# Patient Record
Sex: Female | Born: 1940 | Hispanic: No | Marital: Single | Smoking: Current every day smoker
Health system: Southern US, Community
[De-identification: ages and names within clinical notes are randomized; demographics above are authoritative.]

## PROBLEM LIST (undated history)

## (undated) DIAGNOSIS — E785 Hyperlipidemia, unspecified: Secondary | ICD-10-CM

## (undated) DIAGNOSIS — Z862 Personal history of diseases of the blood and blood-forming organs and certain disorders involving the immune mechanism: Secondary | ICD-10-CM

## (undated) DIAGNOSIS — D649 Anemia, unspecified: Secondary | ICD-10-CM

## (undated) DIAGNOSIS — M109 Gout, unspecified: Secondary | ICD-10-CM

## (undated) DIAGNOSIS — F0390 Unspecified dementia without behavioral disturbance: Secondary | ICD-10-CM

## (undated) DIAGNOSIS — I10 Essential (primary) hypertension: Secondary | ICD-10-CM

## (undated) DIAGNOSIS — S069XAA Unspecified intracranial injury with loss of consciousness status unknown, initial encounter: Secondary | ICD-10-CM

## (undated) DIAGNOSIS — S6990XA Unspecified injury of unspecified wrist, hand and finger(s), initial encounter: Secondary | ICD-10-CM

## (undated) DIAGNOSIS — N189 Chronic kidney disease, unspecified: Secondary | ICD-10-CM

## (undated) DIAGNOSIS — S069X9A Unspecified intracranial injury with loss of consciousness of unspecified duration, initial encounter: Secondary | ICD-10-CM

## (undated) DIAGNOSIS — E119 Type 2 diabetes mellitus without complications: Secondary | ICD-10-CM

## (undated) HISTORY — DX: Unspecified intracranial injury with loss of consciousness of unspecified duration, initial encounter: S06.9X9A

## (undated) HISTORY — DX: Gout, unspecified: M10.9

## (undated) HISTORY — DX: Hyperlipidemia, unspecified: E78.5

## (undated) HISTORY — DX: Chronic kidney disease, unspecified: N18.9

## (undated) HISTORY — DX: Essential (primary) hypertension: I10

## (undated) HISTORY — DX: Unspecified intracranial injury with loss of consciousness status unknown, initial encounter: S06.9XAA

## (undated) HISTORY — PX: CATARACT EXTRACTION: SUR2

## (undated) HISTORY — PX: TOTAL HIP ARTHROPLASTY: SHX124

## (undated) HISTORY — DX: Personal history of diseases of the blood and blood-forming organs and certain disorders involving the immune mechanism: Z86.2

## (undated) HISTORY — DX: Unspecified injury of unspecified wrist, hand and finger(s), initial encounter: S69.90XA

---

## 2011-09-15 DIAGNOSIS — I10 Essential (primary) hypertension: Secondary | ICD-10-CM | POA: Diagnosis not present

## 2011-09-15 DIAGNOSIS — R928 Other abnormal and inconclusive findings on diagnostic imaging of breast: Secondary | ICD-10-CM | POA: Diagnosis not present

## 2011-09-23 DIAGNOSIS — Z1212 Encounter for screening for malignant neoplasm of rectum: Secondary | ICD-10-CM | POA: Diagnosis not present

## 2011-09-25 DIAGNOSIS — N281 Cyst of kidney, acquired: Secondary | ICD-10-CM | POA: Diagnosis not present

## 2011-09-25 DIAGNOSIS — N189 Chronic kidney disease, unspecified: Secondary | ICD-10-CM | POA: Diagnosis not present

## 2011-12-24 DIAGNOSIS — I10 Essential (primary) hypertension: Secondary | ICD-10-CM | POA: Diagnosis not present

## 2012-01-05 DIAGNOSIS — I10 Essential (primary) hypertension: Secondary | ICD-10-CM | POA: Diagnosis not present

## 2012-01-21 DIAGNOSIS — I10 Essential (primary) hypertension: Secondary | ICD-10-CM | POA: Diagnosis not present

## 2012-01-21 DIAGNOSIS — E785 Hyperlipidemia, unspecified: Secondary | ICD-10-CM | POA: Diagnosis not present

## 2012-01-21 DIAGNOSIS — M25569 Pain in unspecified knee: Secondary | ICD-10-CM | POA: Diagnosis not present

## 2012-02-04 DIAGNOSIS — I10 Essential (primary) hypertension: Secondary | ICD-10-CM | POA: Diagnosis not present

## 2012-02-04 DIAGNOSIS — M25569 Pain in unspecified knee: Secondary | ICD-10-CM | POA: Diagnosis not present

## 2012-02-04 DIAGNOSIS — E785 Hyperlipidemia, unspecified: Secondary | ICD-10-CM | POA: Diagnosis not present

## 2012-03-16 DIAGNOSIS — E785 Hyperlipidemia, unspecified: Secondary | ICD-10-CM | POA: Diagnosis not present

## 2012-03-16 DIAGNOSIS — N289 Disorder of kidney and ureter, unspecified: Secondary | ICD-10-CM | POA: Diagnosis not present

## 2012-03-16 DIAGNOSIS — I10 Essential (primary) hypertension: Secondary | ICD-10-CM | POA: Diagnosis not present

## 2012-05-18 ENCOUNTER — Ambulatory Visit (INDEPENDENT_AMBULATORY_CARE_PROVIDER_SITE_OTHER): Payer: Medicare Other | Admitting: Family

## 2012-05-18 ENCOUNTER — Encounter: Payer: Self-pay | Admitting: Family

## 2012-05-18 VITALS — BP 122/60 | HR 113 | Temp 99.8°F | Wt 158.0 lb

## 2012-05-18 DIAGNOSIS — N289 Disorder of kidney and ureter, unspecified: Secondary | ICD-10-CM | POA: Diagnosis not present

## 2012-05-18 DIAGNOSIS — M109 Gout, unspecified: Secondary | ICD-10-CM | POA: Diagnosis not present

## 2012-05-18 DIAGNOSIS — I10 Essential (primary) hypertension: Secondary | ICD-10-CM | POA: Diagnosis not present

## 2012-05-18 DIAGNOSIS — E669 Obesity, unspecified: Secondary | ICD-10-CM

## 2012-05-18 DIAGNOSIS — E7800 Pure hypercholesterolemia, unspecified: Secondary | ICD-10-CM

## 2012-05-18 LAB — BASIC METABOLIC PANEL
BUN: 30 mg/dL — ABNORMAL HIGH (ref 6–23)
Chloride: 105 mEq/L (ref 96–112)
Glucose, Bld: 188 mg/dL — ABNORMAL HIGH (ref 70–99)
Potassium: 3.9 mEq/L (ref 3.5–5.1)

## 2012-05-18 LAB — CBC WITH DIFFERENTIAL/PLATELET
Basophils Absolute: 0 10*3/uL (ref 0.0–0.1)
Hemoglobin: 11.9 g/dL — ABNORMAL LOW (ref 12.0–15.0)
Lymphocytes Relative: 24.5 % (ref 12.0–46.0)
Monocytes Relative: 5.2 % (ref 3.0–12.0)
Neutro Abs: 4.6 10*3/uL (ref 1.4–7.7)
RBC: 3.75 Mil/uL — ABNORMAL LOW (ref 3.87–5.11)
RDW: 15.2 % — ABNORMAL HIGH (ref 11.5–14.6)

## 2012-05-18 LAB — LDL CHOLESTEROL, DIRECT: Direct LDL: 103.9 mg/dL

## 2012-05-18 LAB — LIPID PANEL
Cholesterol: 187 mg/dL (ref 0–200)
Total CHOL/HDL Ratio: 3
VLDL: 44.8 mg/dL — ABNORMAL HIGH (ref 0.0–40.0)

## 2012-05-18 MED ORDER — ALLOPURINOL 100 MG PO TABS: 100.0000 mg | ORAL_TABLET | Freq: Every day | ORAL | Status: DC
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
## 2012-05-19 LAB — HEMOGLOBIN A1C: Hgb A1c MFr Bld: 6.9 % — ABNORMAL HIGH (ref 4.6–6.5)

## 2012-05-21 ENCOUNTER — Ambulatory Visit (INDEPENDENT_AMBULATORY_CARE_PROVIDER_SITE_OTHER): Payer: Medicare Other | Admitting: Family

## 2012-05-21 ENCOUNTER — Encounter: Payer: Self-pay | Admitting: Family

## 2012-05-21 VITALS — BP 140/80 | HR 112 | Temp 99.3°F | Wt 158.0 lb

## 2012-05-21 DIAGNOSIS — N289 Disorder of kidney and ureter, unspecified: Secondary | ICD-10-CM | POA: Diagnosis not present

## 2012-05-21 DIAGNOSIS — I10 Essential (primary) hypertension: Secondary | ICD-10-CM | POA: Diagnosis not present

## 2012-05-21 DIAGNOSIS — E119 Type 2 diabetes mellitus without complications: Secondary | ICD-10-CM
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
## 2012-05-21 NOTE — Patient Instructions (Addendum)
Diabetes Meal Planning Guide The diabetes meal planning guide is a tool to help you plan your meals and snacks. It is important for people with diabetes to manage their blood glucose (sugar) levels. Choosing the right foods and the right amounts throughout your day will help control your blood glucose. Eating right can even help you improve your blood pressure and reach or maintain a healthy weight. CARBOHYDRATE COUNTING MADE EASY When you eat carbohydrates, they turn to sugar. This raises your blood glucose level. Counting carbohydrates can help you control this level so you feel better. When you plan your meals by counting carbohydrates, you can have more flexibility in what you eat and balance your medicine with your food intake. Carbohydrate counting simply means adding up the total amount of carbohydrate grams in your meals and snacks. Try to eat about the same amount at each meal. Foods with carbohydrates are listed below. Each portion below is 1 carbohydrate serving or 15 grams of carbohydrates. Ask your dietician how many grams of carbohydrates you should eat at each meal or snack. Grains and Starches  1 slice bread.    English muffin or hotdog/hamburger bun.    cup cold cereal (unsweetened).   ? cup cooked pasta or rice.    cup starchy vegetables (corn, potatoes, peas, beans, winter squash).   1 tortilla (6 inches).    bagel.   1 waffle or pancake (size of a CD).    cup cooked cereal.   4 to 6 small crackers.  *Whole grain is recommended. Fruit  1 cup fresh unsweetened berries, melon, papaya, pineapple.   1 small fresh fruit.    banana or mango.    cup fruit juice (4 oz unsweetened).    cup canned fruit in natural juice or water.   2 tbs dried fruit.   12 to 15 grapes or cherries.  Milk and Yogurt  1 cup fat-free or 1% milk.   1 cup soy milk.   6 oz light yogurt with sugar-free sweetener.   6 oz low-fat soy yogurt.   6 oz plain yogurt.   Vegetables  1 cup raw or  cup cooked is counted as 0 carbohydrates or a "free" food.   If you eat 3 or more servings at 1 meal, count them as 1 carbohydrate serving.  Other Carbohydrates   oz chips or pretzels.    cup ice cream or frozen yogurt.    cup sherbet or sorbet.   2 inch square cake, no frosting.   1 tbs honey, sugar, jam, jelly, or syrup.   2 small cookies.   3 squares of graham crackers.   3 cups popcorn.   6 crackers.   1 cup broth-based soup.   Count 1 cup casserole or other mixed foods as 2 carbohydrate servings.   Foods with less than 20 calories in a serving may be counted as 0 carbohydrates or a "free" food.  You may want to purchase a book or computer software that lists the carbohydrate gram counts of different foods. In addition, the nutrition facts panel on the labels of the foods you eat are a good source of this information. The label will tell you how big the serving size is and the total number of carbohydrate grams you will be eating per serving. Divide this number by 15 to obtain the number of carbohydrate servings in a portion. Remember, 1 carbohydrate serving equals 15 grams of carbohydrate. SERVING SIZES Measuring foods and serving sizes helps you   make sure you are getting the right amount of food. The list below tells how big or small some common serving sizes are.  1 oz.........4 stacked dice.   3 oz........Marland KitchenDeck of cards.   1 tsp.......Marland KitchenTip of little finger.   1 tbs......Marland KitchenMarland KitchenThumb.   2 tbs.......Marland KitchenGolf ball.    cup......Marland KitchenHalf of a fist.   1 cup.......Marland KitchenA fist.  SAMPLE DIABETES MEAL PLAN Below is a sample meal plan that includes foods from the grain and starches, dairy, vegetable, fruit, and meat groups. A dietician can individualize a meal plan to fit your calorie needs and tell you the number of servings needed from each food group. However, controlling the total amount of carbohydrates in your meal or snack is more important than  making sure you include all of the food groups at every meal. You may interchange carbohydrate containing foods (dairy, starches, and fruits). The meal plan below is an example of a 2000 calorie diet using carbohydrate counting. This meal plan has 17 carbohydrate servings. Breakfast  1 cup oatmeal (2 carb servings).    cup light yogurt (1 carb serving).   1 cup blueberries (1 carb serving).    cup almonds.  Snack  1 large apple (2 carb servings).   1 low-fat string cheese stick.  Lunch  Chicken breast salad.   1 cup spinach.    cup chopped tomatoes.   2 oz chicken breast, sliced.   2 tbs low-fat [place] dressing.   12 whole-wheat crackers (2 carb servings).   12 to 15 grapes (1 carb serving).   1 cup low-fat milk (1 carb serving).  Snack  1 cup carrots.    cup hummus (1 carb serving).  Dinner  3 oz broiled salmon.   1 cup brown rice (3 carb servings).  Snack  1  cups steamed broccoli (1 carb serving) drizzled with 1 tsp olive oil and lemon juice.   1 cup light pudding (2 carb servings).  DIABETES MEAL PLANNING WORKSHEET Your dietician can use this worksheet to help you decide how many servings of foods and what types of foods are right for you.  BREAKFAST Food Group and Servings / Carb Servings Grain/Starches __________________________________ Dairy __________________________________________ Vegetable ______________________________________ Fruit ___________________________________________ Meat __________________________________________ Fat ____________________________________________ LUNCH Food Group and Servings / Carb Servings Grain/Starches ___________________________________ Dairy ___________________________________________ Fruit ____________________________________________ Meat ___________________________________________ Fat _____________________________________________ Laural Golden Food Group and Servings / Carb Servings Grain/Starches  ___________________________________ Dairy ___________________________________________ Fruit ____________________________________________ Meat ___________________________________________ Fat _____________________________________________ SNACKS Food Group and Servings / Carb Servings Grain/Starches ___________________________________ Dairy ___________________________________________ Vegetable _______________________________________ Fruit ____________________________________________ Meat ___________________________________________ Fat _____________________________________________ DAILY TOTALS Starches _________________________ Vegetable ________________________ Fruit ____________________________ Dairy ____________________________ Meat ____________________________ Fat ______________________________ Document Released: 05/22/2005 Document Revised: 08/14/2011 Document Reviewed: 04/02/2009 [employer] Patient Information 2012 [place], [place].  Diabetes, Frequently Asked Questions WHAT IS DIABETES? Most of the food we eat is turned into glucose (sugar). Our bodies use it for energy. The pancreas makes a hormone called insulin. It helps glucose get into the cells of our bodies. When you have diabetes, your body either does not make enough insulin or cannot use its own insulin as well as it should. This causes sugars to build up in your blood. WHAT ARE THE SYMPTOMS OF DIABETES?  Frequent urination.   Excessive thirst.   Unexplained weight loss.   Extreme hunger.   Blurred vision.   Tingling or numbness in hands or feet.   Feeling very tired much of the time.   Dry, itchy skin.   Sores that are slow to  heal.   Yeast infections.  WHAT ARE THE TYPES OF DIABETES? Type 1 Diabetes   About 10% of affected people have this type.   Usually occurs before the age of 38.   Usually occurs in thin to normal weight people.  Type 2 Diabetes  About 90% of affected people have this type.    Usually occurs after the age of 20.   Usually occurs in overweight people.   More likely to have:   A family history of diabetes.   A history of diabetes during pregnancy (gestational diabetes).   High blood pressure.   High cholesterol and triglycerides.  Gestational Diabetes  Occurs in about 4% of pregnancies.   Usually goes away after the baby is born.   More likely to occur in women with:   Family history of diabetes.   Previous gestational diabetes.   Obese.   Over 33 years old.  WHAT IS PRE-DIABETES? Pre-diabetes means your blood glucose is higher than normal, but lower than the diabetes range. It also means you are at risk of getting type 2 diabetes and heart disease. If you are told you have pre-diabetes, have your blood glucose checked again in 1 to 2 years. WHAT IS THE TREATMENT FOR DIABETES? Treatment is aimed at keeping blood glucose near normal levels at all times. Learning how to manage this yourself is important in treating diabetes. Depending on the type of diabetes you have, your treatment will include one or more of the following:  Monitoring your blood glucose.   Meal planning.   Exercise.   Oral medicine (pills) or insulin.  CAN DIABETES BE PREVENTED? With type 1 diabetes, prevention is more difficult, because the triggers that cause it are not yet known. With type 2 diabetes, prevention is more likely, with lifestyle changes:  Maintain a healthy weight.   Eat healthy.   Exercise.  IS THERE A CURE FOR DIABETES? No, there is no cure for diabetes. There is a lot of research going on that is looking for a cure, and progress is being made. Diabetes can be treated and controlled. People with diabetes can manage their diabetes and lead normal, active lives. SHOULD I BE TESTED FOR DIABETES? If you are at least 73 years old, you should be tested for diabetes. You should be tested again every 3 years. If you are 45 or older and overweight, you may  want to get tested more often. If you are younger than 45, overweight, and have one or more of the following risk factors, you should be tested:  Family history of diabetes.   Inactive lifestyle.   High blood pressure.  WHAT ARE SOME OTHER SOURCES FOR INFORMATION ON DIABETES? The following organizations may help in your search for more information on diabetes: National Diabetes Education Program (NDEP) Internet: SolarDiscussions.es American Diabetes Association Internet: http://www.diabetes.org  Juvenile Diabetes Foundation International Internet: Wet[employer]Wash.is Document Released: 08/28/2003 Document Revised: 08/14/2011 Document Reviewed: 06/22/2009 [facility] Patient Information 2012 [place], [place].

## 2012-05-24 ENCOUNTER — Telehealth: Payer: Self-pay | Admitting: Family

## 2012-05-24 NOTE — Telephone Encounter (Signed)
Daughter aware glucometer ready for her to pick up
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
## 2012-05-31 ENCOUNTER — Other Ambulatory Visit: Payer: Self-pay

## 2012-05-31 MED ORDER — FREESTYLE LANCETS MISC: Status: DC

## 2012-05-31 MED ORDER — GLUCOSE BLOOD VI STRP: ORAL_STRIP | Status: DC

## 2012-06-15 ENCOUNTER — Other Ambulatory Visit: Payer: Self-pay | Admitting: Family

## 2012-06-15 MED ORDER — VALSARTAN-HYDROCHLOROTHIAZIDE 320-25 MG PO TABS: 1.0000 | ORAL_TABLET | Freq: Every day | ORAL | Status: DC

## 2012-08-12 ENCOUNTER — Telehealth: Payer: Self-pay | Admitting: Family

## 2012-08-12 MED ORDER — ATORVASTATIN CALCIUM 20 MG PO TABS: 20.0000 mg | ORAL_TABLET | Freq: Every day | ORAL | Status: DC

## 2012-08-12 NOTE — Telephone Encounter (Signed)
Pt is out of atorvastatin (LIPITOR) 20 MG tablet. Pls call refill to [facility] on [address].

## 2012-08-13 ENCOUNTER — Ambulatory Visit (INDEPENDENT_AMBULATORY_CARE_PROVIDER_SITE_OTHER): Payer: Medicare Other | Admitting: Family

## 2012-08-13 ENCOUNTER — Encounter: Payer: Self-pay | Admitting: Family

## 2012-08-13 VITALS — BP 112/74 | HR 113 | Temp 98.7°F | Wt 153.0 lb

## 2012-08-13 DIAGNOSIS — M109 Gout, unspecified: Secondary | ICD-10-CM

## 2012-08-13 DIAGNOSIS — Z23 Encounter for immunization: Secondary | ICD-10-CM

## 2012-08-13 DIAGNOSIS — E785 Hyperlipidemia, unspecified: Secondary | ICD-10-CM | POA: Diagnosis not present

## 2012-08-13 DIAGNOSIS — I10 Essential (primary) hypertension: Secondary | ICD-10-CM | POA: Diagnosis not present

## 2012-08-13 DIAGNOSIS — E119 Type 2 diabetes mellitus without complications: Secondary | ICD-10-CM | POA: Diagnosis not present

## 2012-08-13 DIAGNOSIS — R413 Other amnesia: Secondary | ICD-10-CM

## 2012-08-13 LAB — CBC WITH DIFFERENTIAL/PLATELET
Basophils Absolute: 0 10*3/uL (ref 0.0–0.1)
Basophils Relative: 0.5 % (ref 0.0–3.0)
Eosinophils Absolute: 0.1 10*3/uL (ref 0.0–0.7)
HCT: 38 % (ref 36.0–46.0)
Hemoglobin: 12.3 g/dL (ref 12.0–15.0)
Lymphocytes Relative: 23.2 % (ref 12.0–46.0)
MCHC: 32.4 g/dL (ref 30.0–36.0)
MCV: 96.6 fl (ref 78.0–100.0)
Monocytes Absolute: 0.4 10*3/uL (ref 0.1–1.0)
Neutro Abs: 3.7 10*3/uL (ref 1.4–7.7)
Neutrophils Relative %: 66.7 % (ref 43.0–77.0)
RBC: 3.94 Mil/uL (ref 3.87–5.11)
RDW: 14.1 % (ref 11.5–14.6)
WBC: 5.5 10*3/uL (ref 4.5–10.5)

## 2012-08-13 LAB — BASIC METABOLIC PANEL
BUN: 24 mg/dL — ABNORMAL HIGH (ref 6–23)
GFR: 26.83 mL/min — ABNORMAL LOW (ref >60.00)
Potassium: 3.9 mEq/L (ref 3.5–5.1)
Sodium: 140 mEq/L (ref 135–145)

## 2012-08-13 LAB — HEPATIC FUNCTION PANEL
ALT: 16 U/L (ref 0–35)
AST: 24 U/L (ref 0–37)
Alkaline Phosphatase: 85 U/L (ref 39–117)
Total Bilirubin: 0.4 mg/dL (ref 0.3–1.2)

## 2012-08-13 LAB — LIPID PANEL
HDL: 59.5 mg/dL (ref >39.00)
Total CHOL/HDL Ratio: 3

## 2012-08-13 LAB — TSH: TSH: 0.79 u[IU]/mL (ref 0.35–5.50)

## 2012-08-13 MED ORDER — AMLODIPINE BESYLATE 5 MG PO TABS: 5.0000 mg | ORAL_TABLET | Freq: Every day | ORAL | Status: DC

## 2012-08-13 MED ORDER — GLUCOSE BLOOD VI STRP: ORAL_STRIP | Status: DC

## 2012-08-13 NOTE — Patient Instructions (Addendum)
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
## 2012-08-25 DIAGNOSIS — E1149 Type 2 diabetes mellitus with other diabetic neurological complication: Secondary | ICD-10-CM | POA: Diagnosis not present

## 2012-08-25 DIAGNOSIS — M79609 Pain in unspecified limb: Secondary | ICD-10-CM | POA: Diagnosis not present

## 2012-08-25 DIAGNOSIS — B351 Tinea unguium: Secondary | ICD-10-CM | POA: Diagnosis not present

## 2012-09-17 DIAGNOSIS — M109 Gout, unspecified: Secondary | ICD-10-CM | POA: Diagnosis not present

## 2012-09-17 DIAGNOSIS — N2581 Secondary hyperparathyroidism of renal origin: Secondary | ICD-10-CM | POA: Diagnosis not present

## 2012-09-23 ENCOUNTER — Other Ambulatory Visit: Payer: Self-pay

## 2012-09-23 MED ORDER — FREESTYLE LANCETS MISC: Status: DC

## 2012-11-29 ENCOUNTER — Ambulatory Visit (INDEPENDENT_AMBULATORY_CARE_PROVIDER_SITE_OTHER): Payer: Medicare Other | Admitting: Family

## 2012-11-29 ENCOUNTER — Encounter: Payer: Self-pay | Admitting: Family

## 2012-11-29 VITALS — BP 150/98 | HR 115 | Wt 150.0 lb

## 2012-11-29 DIAGNOSIS — E7800 Pure hypercholesterolemia, unspecified: Secondary | ICD-10-CM

## 2012-11-29 DIAGNOSIS — R7309 Other abnormal glucose: Secondary | ICD-10-CM | POA: Diagnosis not present

## 2012-11-29 DIAGNOSIS — R739 Hyperglycemia, unspecified: Secondary | ICD-10-CM

## 2012-11-29 DIAGNOSIS — M109 Gout, unspecified: Secondary | ICD-10-CM | POA: Diagnosis not present

## 2012-11-29 DIAGNOSIS — I10 Essential (primary) hypertension: Secondary | ICD-10-CM | POA: Diagnosis not present

## 2012-11-29 DIAGNOSIS — Z1231 Encounter for screening mammogram for malignant neoplasm of breast: Secondary | ICD-10-CM

## 2012-11-29 LAB — HEPATIC FUNCTION PANEL
ALT: 17 U/L (ref 0–35)
AST: 18 U/L (ref 0–37)
Albumin: 3.5 g/dL (ref 3.5–5.2)
Alkaline Phosphatase: 83 U/L (ref 39–117)
Total Bilirubin: 0.4 mg/dL (ref 0.3–1.2)

## 2012-11-29 LAB — CBC WITH DIFFERENTIAL/PLATELET
Eosinophils Absolute: 0.1 10*3/uL (ref 0.0–0.7)
Eosinophils Relative: 1.6 % (ref 0.0–5.0)
MCHC: 34.2 g/dL (ref 30.0–36.0)
MCV: 94.8 fl (ref 78.0–100.0)
Monocytes Absolute: 0.4 10*3/uL (ref 0.1–1.0)
Neutrophils Relative %: 65.3 % (ref 43.0–77.0)
Platelets: 198 10*3/uL (ref 150.0–400.0)
WBC: 5.8 10*3/uL (ref 4.5–10.5)

## 2012-11-29 LAB — BASIC METABOLIC PANEL
CO2: 30 mEq/L (ref 19–32)
Calcium: 9.3 mg/dL (ref 8.4–10.5)
Creatinine, Ser: 1.8 mg/dL — ABNORMAL HIGH (ref 0.4–1.2)
GFR: 30 mL/min — ABNORMAL LOW (ref >60.00)
Glucose, Bld: 115 mg/dL — ABNORMAL HIGH (ref 70–99)
Sodium: 139 mEq/L (ref 135–145)

## 2012-11-29 LAB — LIPID PANEL
HDL: 68.1 mg/dL (ref >39.00)
VLDL: 24.8 mg/dL (ref 0.0–40.0)

## 2012-11-29 LAB — HEMOGLOBIN A1C: Hgb A1c MFr Bld: 6.1 % (ref 4.6–6.5)

## 2012-11-29 MED ORDER — ATORVASTATIN CALCIUM 20 MG PO TABS: 20.0000 mg | ORAL_TABLET | Freq: Every day | ORAL | Status: DC

## 2012-11-29 MED ORDER — AMLODIPINE BESYLATE 10 MG PO TABS: 10.0000 mg | ORAL_TABLET | Freq: Every day | ORAL | Status: DC

## 2012-11-29 MED ORDER — ALLOPURINOL 100 MG PO TABS: 100.0000 mg | ORAL_TABLET | Freq: Every day | ORAL | Status: DC
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
## 2012-11-29 NOTE — Patient Instructions (Signed)

## 2012-12-06 ENCOUNTER — Ambulatory Visit: Payer: Self-pay | Admitting: Internal Medicine

## 2012-12-06 ENCOUNTER — Telehealth: Payer: Self-pay | Admitting: Family

## 2012-12-06 ENCOUNTER — Telehealth: Payer: Self-pay | Admitting: Internal Medicine

## 2012-12-06 NOTE — Telephone Encounter (Signed)
Return call placed to [employer] at [phone]. No answer, voicemail message left for caller to return call to office number at [phone].

## 2012-12-06 NOTE — Telephone Encounter (Signed)
Patient Information:  Caller Name: Tamika  Phone: [phone]  Patient: Sherri Mays, Sherri Mays  Gender: Female  DOB: 08/10/1941  Age: 72 Years  PCP: Berniece Andreas (Family Practice)  Office Follow Up:  Does the office need to follow up with this patient?: No  Instructions For The Office: N/A   Symptoms  Reason For Call & Symptoms: Daughter is calling and states that pt was seen in the office on 11/29/12; BP was noted to be high at this time; Norvasc  was  increased to 10mg  daily;   BP 190/100 today 09:30am; no other sx noted; daughter is concerned;  Reviewed Health History In EMR: Yes  Reviewed Medications In EMR: Yes  Reviewed Allergies In EMR: Yes  Reviewed Surgeries / Procedures: Yes  Date of Onset of Symptoms: 11/29/2012  Guideline(s) Used:  High Blood Pressure  Disposition Per Guideline:   See Today in Office  Reason For Disposition Reached:   BP > 180/110  Advice Given:  N/A  Patient Will Follow Care Advice:  YES  Appointment Scheduled:  12/06/2012 14:15:00 Appointment Scheduled Provider:  Berniece Andreas (Family Practice)

## 2012-12-06 NOTE — Telephone Encounter (Signed)
Noted  

## 2013-01-05 ENCOUNTER — Ambulatory Visit: Payer: Medicare Other

## 2013-01-17 DIAGNOSIS — I129 Hypertensive chronic kidney disease with stage 1 through stage 4 chronic kidney disease, or unspecified chronic kidney disease: Secondary | ICD-10-CM | POA: Diagnosis not present

## 2013-02-03 ENCOUNTER — Other Ambulatory Visit: Payer: Self-pay

## 2013-02-03 MED ORDER — VALSARTAN-HYDROCHLOROTHIAZIDE 320-25 MG PO TABS: 1.0000 | ORAL_TABLET | Freq: Every day | ORAL | Status: DC

## 2013-03-31 ENCOUNTER — Ambulatory Visit (INDEPENDENT_AMBULATORY_CARE_PROVIDER_SITE_OTHER): Payer: Medicare Other | Admitting: Family

## 2013-03-31 ENCOUNTER — Encounter: Payer: Self-pay | Admitting: Family

## 2013-03-31 VITALS — BP 122/62 | HR 75 | Wt 145.0 lb

## 2013-03-31 DIAGNOSIS — R7309 Other abnormal glucose: Secondary | ICD-10-CM | POA: Diagnosis not present

## 2013-03-31 DIAGNOSIS — I10 Essential (primary) hypertension: Secondary | ICD-10-CM

## 2013-03-31 DIAGNOSIS — R739 Hyperglycemia, unspecified: Secondary | ICD-10-CM

## 2013-03-31 DIAGNOSIS — E7800 Pure hypercholesterolemia, unspecified: Secondary | ICD-10-CM

## 2013-03-31 DIAGNOSIS — M109 Gout, unspecified: Secondary | ICD-10-CM | POA: Diagnosis not present

## 2013-03-31 LAB — CBC WITH DIFFERENTIAL/PLATELET
Basophils Relative: 0.4 % (ref 0.0–3.0)
Eosinophils Relative: 2.9 % (ref 0.0–5.0)
Lymphocytes Relative: 32.1 % (ref 12.0–46.0)
MCV: 94.8 fl (ref 78.0–100.0)
Monocytes Absolute: 0.4 10*3/uL (ref 0.1–1.0)
Monocytes Relative: 5.7 % (ref 3.0–12.0)
Neutrophils Relative %: 58.9 % (ref 43.0–77.0)
Platelets: 212 10*3/uL (ref 150.0–400.0)
RBC: 3.82 Mil/uL — ABNORMAL LOW (ref 3.87–5.11)
WBC: 6.6 10*3/uL (ref 4.5–10.5)

## 2013-03-31 LAB — LIPID PANEL
Cholesterol: 169 mg/dL (ref 0–200)
HDL: 74.7 mg/dL (ref >39.00)
LDL Cholesterol: 76 mg/dL (ref 0–99)
Total CHOL/HDL Ratio: 2
Triglycerides: 94 mg/dL (ref 0.0–149.0)
VLDL: 18.8 mg/dL (ref 0.0–40.0)

## 2013-03-31 LAB — BASIC METABOLIC PANEL
BUN: 38 mg/dL — ABNORMAL HIGH (ref 6–23)
Calcium: 9.5 mg/dL (ref 8.4–10.5)
Chloride: 105 mEq/L (ref 96–112)
Creatinine, Ser: 1.9 mg/dL — ABNORMAL HIGH (ref 0.4–1.2)
GFR: 28.3 mL/min — ABNORMAL LOW (ref >60.00)

## 2013-03-31 LAB — HEPATIC FUNCTION PANEL
Albumin: 3.7 g/dL (ref 3.5–5.2)
Alkaline Phosphatase: 81 U/L (ref 39–117)
Total Protein: 7.4 g/dL (ref 6.0–8.3)

## 2013-03-31 MED ORDER — VALSARTAN-HYDROCHLOROTHIAZIDE 320-25 MG PO TABS: 1.0000 | ORAL_TABLET | Freq: Every day | ORAL | Status: DC

## 2013-03-31 MED ORDER — ATORVASTATIN CALCIUM 20 MG PO TABS: 20.0000 mg | ORAL_TABLET | Freq: Every day | ORAL | Status: DC
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
## 2013-04-01 ENCOUNTER — Telehealth: Payer: Self-pay

## 2013-04-01 MED ORDER — VALSARTAN 320 MG PO TABS: 320.0000 mg | ORAL_TABLET | Freq: Every day | ORAL | Status: DC

## 2013-04-01 NOTE — Telephone Encounter (Signed)
Message copied by Beverely Low on Fri Apr 01, 2013  2:11 PM ------      Message from: [place], [place] B      Created: Fri Apr 01, 2013  9:50 AM       Continued renal insufficiency. Let's change blood pressure medicine  Diovan 320 mg only instead of Diovan HCT to see if her kidneys are better. Please notify patient melena now and often in a prescription for Diovan 320 mg once daily ------

## 2013-04-01 NOTE — Telephone Encounter (Signed)
Pt's daughter aware and Rx sent to pharmacy

## 2013-05-26 ENCOUNTER — Ambulatory Visit: Payer: Medicare Other | Admitting: Family

## 2013-05-30 DIAGNOSIS — N184 Chronic kidney disease, stage 4 (severe): Secondary | ICD-10-CM | POA: Diagnosis not present

## 2013-05-30 DIAGNOSIS — B351 Tinea unguium: Secondary | ICD-10-CM | POA: Diagnosis not present

## 2013-05-30 DIAGNOSIS — M79609 Pain in unspecified limb: Secondary | ICD-10-CM | POA: Diagnosis not present

## 2013-05-31 ENCOUNTER — Ambulatory Visit (INDEPENDENT_AMBULATORY_CARE_PROVIDER_SITE_OTHER): Payer: Medicare Other | Admitting: Family

## 2013-05-31 ENCOUNTER — Encounter: Payer: Self-pay | Admitting: Family

## 2013-05-31 VITALS — BP 120/68 | HR 83 | Wt 145.0 lb

## 2013-05-31 DIAGNOSIS — I10 Essential (primary) hypertension: Secondary | ICD-10-CM | POA: Diagnosis not present

## 2013-05-31 DIAGNOSIS — Z23 Encounter for immunization: Secondary | ICD-10-CM

## 2013-05-31 DIAGNOSIS — E7800 Pure hypercholesterolemia, unspecified: Secondary | ICD-10-CM

## 2013-05-31 MED ORDER — AMLODIPINE BESYLATE 10 MG PO TABS: 10.0000 mg | ORAL_TABLET | Freq: Every day | ORAL | Status: DC

## 2013-05-31 MED ORDER — ALLOPURINOL 100 MG PO TABS: 100.0000 mg | ORAL_TABLET | Freq: Every day | ORAL | Status: DC
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
## 2013-08-09 ENCOUNTER — Other Ambulatory Visit: Payer: Self-pay | Admitting: Family

## 2013-09-27 ENCOUNTER — Ambulatory Visit (INDEPENDENT_AMBULATORY_CARE_PROVIDER_SITE_OTHER): Payer: Medicare Other | Admitting: Family

## 2013-09-27 ENCOUNTER — Encounter: Payer: Self-pay | Admitting: Family

## 2013-09-27 VITALS — BP 148/90 | HR 97 | Wt 149.0 lb

## 2013-09-27 DIAGNOSIS — E7800 Pure hypercholesterolemia, unspecified: Secondary | ICD-10-CM

## 2013-09-27 DIAGNOSIS — R7309 Other abnormal glucose: Secondary | ICD-10-CM | POA: Diagnosis not present

## 2013-09-27 DIAGNOSIS — I10 Essential (primary) hypertension: Secondary | ICD-10-CM | POA: Diagnosis not present

## 2013-09-27 DIAGNOSIS — R739 Hyperglycemia, unspecified: Secondary | ICD-10-CM

## 2013-09-27 DIAGNOSIS — M109 Gout, unspecified: Secondary | ICD-10-CM

## 2013-09-27 DIAGNOSIS — R269 Unspecified abnormalities of gait and mobility: Secondary | ICD-10-CM

## 2013-09-27 DIAGNOSIS — R2681 Unsteadiness on feet: Secondary | ICD-10-CM

## 2013-09-27 LAB — URIC ACID: Uric Acid, Serum: 4.7 mg/dL (ref 2.4–7.0)

## 2013-09-27 LAB — HEPATIC FUNCTION PANEL
ALT: 15 U/L (ref 0–35)
AST: 21 U/L (ref 0–37)
Albumin: 3.4 g/dL — ABNORMAL LOW (ref 3.5–5.2)
Alkaline Phosphatase: 90 U/L (ref 39–117)
BILIRUBIN TOTAL: 0.6 mg/dL (ref 0.3–1.2)
Bilirubin, Direct: 0 mg/dL (ref 0.0–0.3)
TOTAL PROTEIN: 7.4 g/dL (ref 6.0–8.3)

## 2013-09-27 LAB — LIPID PANEL
CHOL/HDL RATIO: 2
CHOLESTEROL: 173 mg/dL (ref 0–200)
HDL: 90.8 mg/dL (ref >39.00)
LDL CALC: 65 mg/dL (ref 0–99)
Triglycerides: 85 mg/dL (ref 0.0–149.0)
VLDL: 17 mg/dL (ref 0.0–40.0)

## 2013-09-27 LAB — BASIC METABOLIC PANEL
BUN: 32 mg/dL — AB (ref 6–23)
CHLORIDE: 110 meq/L (ref 96–112)
CO2: 26 meq/L (ref 19–32)
CREATININE: 1.9 mg/dL — AB (ref 0.4–1.2)
Calcium: 9.3 mg/dL (ref 8.4–10.5)
GFR: 27.4 mL/min — ABNORMAL LOW (ref >60.00)
Glucose, Bld: 102 mg/dL — ABNORMAL HIGH (ref 70–99)
POTASSIUM: 4.8 meq/L (ref 3.5–5.1)
Sodium: 144 mEq/L (ref 135–145)

## 2013-09-27 LAB — HEMOGLOBIN A1C: HEMOGLOBIN A1C: 5.7 % (ref 4.6–6.5)

## 2013-09-27 NOTE — Patient Instructions (Signed)
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
## 2013-09-28 ENCOUNTER — Ambulatory Visit: Payer: Medicare Other

## 2013-10-03 ENCOUNTER — Ambulatory Visit: Payer: Medicare Other | Admitting: Family

## 2013-10-06 DIAGNOSIS — I129 Hypertensive chronic kidney disease with stage 1 through stage 4 chronic kidney disease, or unspecified chronic kidney disease: Secondary | ICD-10-CM | POA: Diagnosis not present

## 2013-10-06 DIAGNOSIS — N183 Chronic kidney disease, stage 3 unspecified: Secondary | ICD-10-CM | POA: Diagnosis not present

## 2013-10-07 ENCOUNTER — Other Ambulatory Visit: Payer: Self-pay | Admitting: Family

## 2013-10-09 ENCOUNTER — Telehealth: Payer: Self-pay | Admitting: Family

## 2013-10-09 NOTE — Telephone Encounter (Signed)
Relevant patient education mailed to patient.  

## 2013-10-12 ENCOUNTER — Telehealth: Payer: Self-pay | Admitting: Family

## 2013-10-12 NOTE — Telephone Encounter (Signed)
Relevant patient education mailed to patient.  

## 2013-11-02 ENCOUNTER — Ambulatory Visit: Payer: Medicare Other | Attending: Family

## 2013-11-02 DIAGNOSIS — M109 Gout, unspecified: Secondary | ICD-10-CM | POA: Diagnosis not present

## 2013-11-02 DIAGNOSIS — I10 Essential (primary) hypertension: Secondary | ICD-10-CM | POA: Insufficient documentation

## 2013-11-02 DIAGNOSIS — R262 Difficulty in walking, not elsewhere classified: Secondary | ICD-10-CM | POA: Diagnosis not present

## 2013-11-02 DIAGNOSIS — M6281 Muscle weakness (generalized): Secondary | ICD-10-CM | POA: Diagnosis not present

## 2013-11-02 DIAGNOSIS — IMO0001 Reserved for inherently not codable concepts without codable children: Secondary | ICD-10-CM | POA: Insufficient documentation

## 2013-12-29 DIAGNOSIS — N183 Chronic kidney disease, stage 3 unspecified: Secondary | ICD-10-CM | POA: Diagnosis not present

## 2013-12-29 DIAGNOSIS — I129 Hypertensive chronic kidney disease with stage 1 through stage 4 chronic kidney disease, or unspecified chronic kidney disease: Secondary | ICD-10-CM | POA: Diagnosis not present

## 2014-01-10 ENCOUNTER — Other Ambulatory Visit: Payer: Self-pay | Admitting: Family

## 2014-01-13 ENCOUNTER — Other Ambulatory Visit: Payer: Self-pay | Admitting: Family

## 2014-01-31 ENCOUNTER — Ambulatory Visit (INDEPENDENT_AMBULATORY_CARE_PROVIDER_SITE_OTHER): Payer: Medicare Other | Admitting: Family

## 2014-01-31 ENCOUNTER — Encounter: Payer: Self-pay | Admitting: Family

## 2014-01-31 ENCOUNTER — Other Ambulatory Visit: Payer: Self-pay | Admitting: Family

## 2014-01-31 VITALS — BP 180/80 | HR 97 | Temp 99.0°F | Wt 149.2 lb

## 2014-01-31 DIAGNOSIS — E785 Hyperlipidemia, unspecified: Secondary | ICD-10-CM

## 2014-01-31 DIAGNOSIS — I10 Essential (primary) hypertension: Secondary | ICD-10-CM

## 2014-01-31 DIAGNOSIS — M109 Gout, unspecified: Secondary | ICD-10-CM

## 2014-01-31 MED ORDER — AMLODIPINE BESYLATE 5 MG PO TABS: 5.0000 mg | ORAL_TABLET | Freq: Every day | ORAL | Status: DC

## 2014-01-31 MED ORDER — METOPROLOL SUCCINATE ER 25 MG PO TB24: 25.0000 mg | ORAL_TABLET | Freq: Every day | ORAL | Status: DC

## 2014-01-31 NOTE — Patient Instructions (Signed)

## 2014-01-31 NOTE — Progress Notes (Signed)
Pre visit review using our clinic review tool, if applicable. No additional management support is needed unless otherwise documented below in the visit note. 
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
## 2014-02-01 ENCOUNTER — Encounter: Payer: Self-pay | Admitting: Family

## 2014-02-22 ENCOUNTER — Ambulatory Visit: Payer: Medicare Other | Admitting: Family

## 2014-03-13 ENCOUNTER — Telehealth: Payer: Self-pay | Admitting: Family

## 2014-03-13 MED ORDER — METOPROLOL SUCCINATE ER 25 MG PO TB24: ORAL_TABLET | ORAL | Status: DC

## 2014-03-13 NOTE — Telephone Encounter (Signed)
Pt is sch for 03/15/14 at 0945 for bp f/u.  She would like a re-fill metoprolol succinate (TOPROL-XL) 25 MG 24 hr tablet, pt is completely out [facility] on [address].

## 2014-03-13 NOTE — Telephone Encounter (Signed)
Done

## 2014-03-15 ENCOUNTER — Other Ambulatory Visit: Payer: Self-pay | Admitting: Family

## 2014-03-15 ENCOUNTER — Encounter: Payer: Self-pay | Admitting: Family

## 2014-03-15 ENCOUNTER — Ambulatory Visit (INDEPENDENT_AMBULATORY_CARE_PROVIDER_SITE_OTHER): Payer: Medicare Other | Admitting: Family

## 2014-03-15 VITALS — BP 134/78 | HR 108 | Wt 148.0 lb

## 2014-03-15 DIAGNOSIS — F17200 Nicotine dependence, unspecified, uncomplicated: Secondary | ICD-10-CM | POA: Diagnosis not present

## 2014-03-15 DIAGNOSIS — E7800 Pure hypercholesterolemia, unspecified: Secondary | ICD-10-CM

## 2014-03-15 DIAGNOSIS — M109 Gout, unspecified: Secondary | ICD-10-CM

## 2014-03-15 DIAGNOSIS — Z720 Tobacco use: Secondary | ICD-10-CM

## 2014-03-15 DIAGNOSIS — K2100 Gastro-esophageal reflux disease with esophagitis, without bleeding: Secondary | ICD-10-CM | POA: Diagnosis not present

## 2014-03-15 DIAGNOSIS — N289 Disorder of kidney and ureter, unspecified: Secondary | ICD-10-CM

## 2014-03-15 DIAGNOSIS — I10 Essential (primary) hypertension: Secondary | ICD-10-CM

## 2014-03-15 LAB — LIPID PANEL
CHOLESTEROL: 165 mg/dL (ref 0–200)
HDL: 81.7 mg/dL (ref >39.00)
LDL CALC: 64 mg/dL (ref 0–99)
NonHDL: 83.3
TRIGLYCERIDES: 96 mg/dL (ref 0.0–149.0)
Total CHOL/HDL Ratio: 2
VLDL: 19.2 mg/dL (ref 0.0–40.0)

## 2014-03-15 LAB — URIC ACID: Uric Acid, Serum: 5.8 mg/dL (ref 2.4–7.0)

## 2014-03-15 LAB — HEPATIC FUNCTION PANEL
ALT: 15 U/L (ref 0–35)
AST: 22 U/L (ref 0–37)
Albumin: 3.5 g/dL (ref 3.5–5.2)
Alkaline Phosphatase: 90 U/L (ref 39–117)
BILIRUBIN DIRECT: 0 mg/dL (ref 0.0–0.3)
BILIRUBIN TOTAL: 0.4 mg/dL (ref 0.2–1.2)
TOTAL PROTEIN: 7.5 g/dL (ref 6.0–8.3)

## 2014-03-15 LAB — BASIC METABOLIC PANEL
BUN: 48 mg/dL — ABNORMAL HIGH (ref 6–23)
CHLORIDE: 104 meq/L (ref 96–112)
CO2: 25 mEq/L (ref 19–32)
Calcium: 9.1 mg/dL (ref 8.4–10.5)
Creatinine, Ser: 2.3 mg/dL — ABNORMAL HIGH (ref 0.4–1.2)
GFR: 21.84 mL/min — ABNORMAL LOW (ref >60.00)
Glucose, Bld: 117 mg/dL — ABNORMAL HIGH (ref 70–99)
Potassium: 4.1 mEq/L (ref 3.5–5.1)
SODIUM: 139 meq/L (ref 135–145)

## 2014-03-15 NOTE — Progress Notes (Signed)
Pre visit review using our clinic review tool, if applicable. No additional management support is needed unless otherwise documented below in the visit note. 

## 2014-03-15 NOTE — Patient Instructions (Signed)
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
## 2014-04-10 ENCOUNTER — Other Ambulatory Visit: Payer: Self-pay | Admitting: Family

## 2014-04-15 ENCOUNTER — Other Ambulatory Visit: Payer: Self-pay | Admitting: Family

## 2014-04-20 ENCOUNTER — Telehealth: Payer: Self-pay | Admitting: Family

## 2014-04-20 ENCOUNTER — Other Ambulatory Visit: Payer: Self-pay

## 2014-04-20 MED ORDER — ATORVASTATIN CALCIUM 20 MG PO TABS
ORAL_TABLET | ORAL | Status: DC
Start: 2014-04-20 — End: 2014-10-20

## 2014-04-20 NOTE — Telephone Encounter (Signed)
Patient Information:  Caller Name: Timeko  Phone: [phone]  Patient: Sherri Mays, Sherri Mays  Gender: Female  DOB: 12-Nov-1940  Age: [age] Years  PCP: Adline Mangoampbell, [facility](Family Practice)  Office Follow Up:  Does the office need to follow up with this patient?: No  Instructions For The Office: N/A   Symptoms  Reason For Call & Symptoms: Pt is haivng some bilateral  edema in her feet and ankles.  Does she need her B/P medications adjusted?  Reviewed Health History In EMR: Yes  Reviewed Medications In EMR: Yes  Reviewed Allergies In EMR: Yes  Reviewed Surgeries / Procedures: Yes  Date of Onset of Symptoms: 04/20/2014  Guideline(s) Used:  Leg Swelling and Edema  Disposition Per Guideline:   See Within 3 Days in Office  Reason For Disposition Reached:   Mild swelling of both ankles (i.e., pedal edema) AND new onset or worsening  Advice Given:  N/A  Patient Will Follow Care Advice:  YES  Appointment Scheduled:  04/21/2014 11:00:00 Appointment Scheduled Provider:  Tana ConchHunter, Stephen

## 2014-04-20 NOTE — Telephone Encounter (Signed)
Noted as pt has appt for 8/14 with Dr Durene CalHunter.

## 2014-04-21 ENCOUNTER — Ambulatory Visit (INDEPENDENT_AMBULATORY_CARE_PROVIDER_SITE_OTHER): Payer: Medicare Other | Admitting: Family Medicine

## 2014-04-21 ENCOUNTER — Encounter: Payer: Self-pay | Admitting: Family Medicine

## 2014-04-21 VITALS — BP 122/78 | HR 80 | Temp 99.4°F | Ht 61.0 in | Wt 146.0 lb

## 2014-04-21 DIAGNOSIS — R609 Edema, unspecified: Secondary | ICD-10-CM | POA: Diagnosis not present

## 2014-04-21 DIAGNOSIS — I10 Essential (primary) hypertension: Secondary | ICD-10-CM | POA: Diagnosis not present

## 2014-04-21 DIAGNOSIS — N289 Disorder of kidney and ureter, unspecified: Secondary | ICD-10-CM

## 2014-04-21 LAB — BASIC METABOLIC PANEL
BUN: 43 mg/dL — AB (ref 6–23)
CO2: 27 mEq/L (ref 19–32)
CREATININE: 2.3 mg/dL — AB (ref 0.4–1.2)
Calcium: 9.5 mg/dL (ref 8.4–10.5)
Chloride: 104 mEq/L (ref 96–112)
GFR: 22.4 mL/min — AB (ref >60.00)
GLUCOSE: 130 mg/dL — AB (ref 70–99)
POTASSIUM: 4.4 meq/L (ref 3.5–5.1)
Sodium: 137 mEq/L (ref 135–145)

## 2014-04-21 NOTE — Assessment & Plan Note (Signed)
Restart valsartan hydrochlorothiazide. Continue amlodipine 10 mg and metoprolol 25 mg. Patient to followup early next week to monitor blood pressure control on this regimen. May be able to stop hydrochlorothiazide which would be of benefit with history of gout. Have also encouraged patient to follow up with nephrology due to creatinine of 2.3.  Most essential medication is valsartan due to chronic kidney disease. I believe the edema the patient would experiencing was due to coming off of hydrochlorothiazide (combination pill with valsartan was stopped on Wednesday.). We discussed could also be a side effect of amlodipine.

## 2014-04-21 NOTE — Progress Notes (Signed)
Sherri Conch, MD Phone: [phone]  Subjective:   Sherri Mays is a 73 y.o. year old very pleasant female patient who presents with the following:  Edema CKD Stage III-IV Patient was seen on 03/15/14 by PCP. Appears she may have been started on metoprolol for SBP 134. Valsartan/HCTZ was not on medication list at that visit but had been prescribed previously by Dr. Briant Cedar of Nephrology. Patient was noted to have a creatinine increase to 2.3 from 1.9 at that visit and was encouraged to see nephrology. Daughter thought she had taken her Sherri Mays to nephrology so we had records faxed today but most recent visit was in April which we already had a copy of which stated she should be on valsartan-hctz as well as amlodipine. There was no mention of the metoprolol. Daughter has 3 bottles of valsartan-hctz as well as a bottle of valsartan alone.   Daughter reports coming into office on Wednesday to have medications checked and she was told to stop the valsartan/hctz as it was not on our medication list at this office. On Thursday, daughter noted swelling in her mothers feet and ankles when she was bathing her.   Spoke with PCP Adline Mango and her thought was that she had stopped HCTZ due to history of gout. Phone note 09/27/13 shows "Please be sure she is NOT taking Diovan HCT. Should be taking Diovan only.".   ROS- Patient denies chest pain or shortness of breath or palpitations. No PND or orthopnea.   Past Medical History- Patient Active Problem List   Diagnosis Date Noted  . Gout 05/18/2012  . Hypertension 05/18/2012  . Hypercholesteremia 05/18/2012  . Renal insufficiency 05/18/2012   Medications- reviewed and updated Current Outpatient Prescriptions  Medication Sig Dispense Refill  . allopurinol (ZYLOPRIM) 100 MG tablet TAKE 1 TABLET BY MOUTH DAILY  90 tablet  1  . amLODipine (NORVASC) 10 MG tablet TAKE 1 TABLET BY MOUTH DAILY  90 tablet  1  . atorvastatin (LIPITOR) 20 MG tablet  TAKE 1 TABLET BY MOUTH DAILY  90 tablet  0  . glucose blood test strip Use to check blood sugar twice daily  100 each  5  . Lancets (FREESTYLE) lancets Use to check blood sugar twice daily  100 each  3  . metoprolol succinate (TOPROL-XL) 25 MG 24 hr tablet TAKE 1 TABLET BY MOUTH DAILY  90 tablet  0   Objective: BP 122/78  Pulse 80  Temp(Src) 99.4 F (37.4 C)  Ht 5\' 1"  (1.549 m)  Wt 146 lb (66.225 kg)  BMI 27.60 kg/m2 Gen: NAD, resting comfortably CV: RRR no murmurs rubs or gallops, no JVD Lungs: CTAB no crackles, wheeze, rhonchi Ext: 1+ pitting edema bilaterally, no calf tenderness Skin: warm, dry, no rash  Results for orders placed in visit on 04/21/14 (from the past 24 hour(s))  BASIC METABOLIC PANEL     Status: Abnormal   Collection Time    04/21/14 12:20 PM      Result Value Ref Range   Sodium 137  135 - 145 mEq/L   Potassium 4.4  3.5 - 5.1 mEq/L   Chloride 104  96 - 112 mEq/L   CO2 27  19 - 32 mEq/L   Glucose, Bld 130 (*) 70 - 99 mg/dL   BUN 43 (*) 6 - 23 mg/dL   Creatinine, Ser 2.3 (*) 0.4 - 1.2 mg/dL   Calcium 9.5  8.4 - 86.5 mg/dL   GFR 78.46 (*) >96.29 mL/min    Assessment/Plan:  Hypertension CKD stage III ? stage IV Peripheral edema Restart valsartan hydrochlorothiazide. Continue amlodipine 10 mg and metoprolol 25 mg. Patient to followup early next week to monitor blood pressure control on this regimen. May be able to stop hydrochlorothiazide which would be of benefit with history of gout. Also consider stopping the metoprolol as do not see obvious indication other than blood pressure control. Have also encouraged patient to follow up with nephrology due to creatinine of 2.3.  Most essential medication is valsartan due to chronic kidney disease. I believe the edema the patient would experiencing was due to coming off of hydrochlorothiazide (combination pill with valsartan was stopped on Wednesday.). We discussed could also be a side effect of amlodipine. No red  flags for heart failure or other etiology of edema.  Meds ordered this encounter  Medications  . valsartan-hydrochlorothiazide (DIOVAN-HCT) 320-25 MG per tablet    Sig: 1 tablet daily.   >50% of 30 minute office visit was spent on counseling (how to keep medicatoins in order, marking bottles with times to take medication and with use of each medicine) and coordination of care (contacting PCP and renal office for uptodate recrods and review of these records.

## 2014-04-21 NOTE — Patient Instructions (Signed)
I wonder if the swelling is from her stopping the hydrochlorothiazide on Wednesday.  Let's restart the valsartan-hctz.  See us back on Monday to check blood pressure.  We are going to check her kidney function and if elevated beyond a certain level may need to stop it.  I would encourage you to schedule an appointment at the next available to see the kidney doctor.  Let us know if your mother has any lightheadedness or dizziness or worsening of edema or shortness of breath.

## 2014-06-12 ENCOUNTER — Encounter: Payer: Self-pay | Admitting: *Deleted

## 2014-06-12 NOTE — Progress Notes (Signed)
   Subjective:    Patient ID: Sherri KindredJanet Devera, female    DOB: July 23, 1941, 73 y.o.   MRN: [id number]  HPI  Pt presents for nail debridement   Review of Systems     Objective:   Physical Exam        Assessment & Plan:

## 2014-07-10 ENCOUNTER — Ambulatory Visit (INDEPENDENT_AMBULATORY_CARE_PROVIDER_SITE_OTHER): Payer: Medicare Other | Admitting: Podiatry

## 2014-07-10 DIAGNOSIS — B351 Tinea unguium: Secondary | ICD-10-CM

## 2014-07-10 DIAGNOSIS — M79673 Pain in unspecified foot: Secondary | ICD-10-CM | POA: Diagnosis not present

## 2014-07-11 NOTE — Progress Notes (Signed)
Subjective:     Patient ID: Sherri KindredJanet Mays, female   DOB: March 05, 1941, 73 y.o.   MRN: [id number]  HPIpatient presents with nail disease 1-5 both feet that are thick hard for her to cut and painful   Review of Systems     Objective:   Physical Exam Neurovascular status intact with thick yellow brittle nailbeds 1-5 both feet that are painful    Assessment:     Mycotic nail infection with pain 1-5 both feet    Plan:     Debridement of painful nailbeds 1-5 both feet with no iatrogenic bleeding noted

## 2014-07-25 DIAGNOSIS — N183 Chronic kidney disease, stage 3 (moderate): Secondary | ICD-10-CM | POA: Diagnosis not present

## 2014-07-25 DIAGNOSIS — Z23 Encounter for immunization: Secondary | ICD-10-CM | POA: Diagnosis not present

## 2014-07-25 DIAGNOSIS — I129 Hypertensive chronic kidney disease with stage 1 through stage 4 chronic kidney disease, or unspecified chronic kidney disease: Secondary | ICD-10-CM | POA: Diagnosis not present

## 2014-09-04 ENCOUNTER — Other Ambulatory Visit: Payer: Self-pay | Admitting: *Deleted

## 2014-09-04 MED ORDER — ALLOPURINOL 100 MG PO TABS: 100.0000 mg | ORAL_TABLET | Freq: Every day | ORAL | Status: DC

## 2014-09-25 ENCOUNTER — Other Ambulatory Visit: Payer: Medicaid Other

## 2014-10-20 ENCOUNTER — Other Ambulatory Visit: Payer: Self-pay | Admitting: Family

## 2015-01-17 ENCOUNTER — Telehealth: Payer: Self-pay

## 2015-01-17 ENCOUNTER — Ambulatory Visit (INDEPENDENT_AMBULATORY_CARE_PROVIDER_SITE_OTHER): Payer: Medicare Other | Admitting: Adult Health

## 2015-01-17 ENCOUNTER — Encounter: Payer: Self-pay | Admitting: Adult Health

## 2015-01-17 VITALS — BP 152/88 | Temp 98.9°F | Wt 146.6 lb

## 2015-01-17 DIAGNOSIS — Z7689 Persons encountering health services in other specified circumstances: Secondary | ICD-10-CM | POA: Insufficient documentation

## 2015-01-17 DIAGNOSIS — E119 Type 2 diabetes mellitus without complications: Secondary | ICD-10-CM

## 2015-01-17 DIAGNOSIS — I10 Essential (primary) hypertension: Secondary | ICD-10-CM | POA: Diagnosis not present

## 2015-01-17 DIAGNOSIS — Z23 Encounter for immunization: Secondary | ICD-10-CM | POA: Diagnosis not present

## 2015-01-17 LAB — BASIC METABOLIC PANEL
BUN: 59 mg/dL — ABNORMAL HIGH (ref 6–23)
CO2: 26 mEq/L (ref 19–32)
CREATININE: 3.84 mg/dL — AB (ref 0.40–1.20)
Calcium: 9.4 mg/dL (ref 8.4–10.5)
Chloride: 103 mEq/L (ref 96–112)
GFR: 12.12 mL/min — CL (ref >60.00)
Glucose, Bld: 100 mg/dL — ABNORMAL HIGH (ref 70–99)
POTASSIUM: 4.7 meq/L (ref 3.5–5.1)
Sodium: 137 mEq/L (ref 135–145)

## 2015-01-17 LAB — HEMOGLOBIN A1C: HEMOGLOBIN A1C: 5.8 % (ref 4.6–6.5)

## 2015-01-17 MED ORDER — AMLODIPINE BESYLATE 10 MG PO TABS: 10.0000 mg | ORAL_TABLET | Freq: Every day | ORAL | Status: DC

## 2015-01-17 MED ORDER — ALLOPURINOL 100 MG PO TABS: 100.0000 mg | ORAL_TABLET | Freq: Every day | ORAL | Status: DC

## 2015-01-17 MED ORDER — VALSARTAN-HYDROCHLOROTHIAZIDE 320-25 MG PO TABS: 1.0000 | ORAL_TABLET | Freq: Every day | ORAL | Status: DC

## 2015-01-17 MED ORDER — ATORVASTATIN CALCIUM 20 MG PO TABS: 20.0000 mg | ORAL_TABLET | Freq: Every day | ORAL | Status: DC

## 2015-01-17 MED ORDER — METOPROLOL SUCCINATE ER 25 MG PO TB24: ORAL_TABLET | ORAL | Status: DC
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
## 2015-01-17 NOTE — Progress Notes (Signed)
Pre visit review using our clinic review tool, if applicable. No additional management support is needed unless otherwise documented below in the visit note. 

## 2015-01-17 NOTE — Telephone Encounter (Signed)
CRITICAL VALUE STICKER  CRITICAL VALUE: GFR 12.12 and BUN 59 and Creat 3.84   RECEIVER (INITALS):ACM  DATE & TIME NOTIFIED:5.11.2016 at 4:54 pm   MD NOTIFIED: [name] AGNP  TIME OF NOTIFICATION:4:56 pm  RESPONSE: Called and spoke with Kandee Keenory on his cell phone and he states pt is in renal failure and he will review the labs and contact pt if needed.

## 2015-01-17 NOTE — Patient Instructions (Addendum)
It was great meeting you today! Continue to measure your blood pressure at home. Inform me if your blood pressures are in the 140's/ 150's. Come back in three months to recheck some blood work and then in January for a complete physical. Let me know if you need anything  Health Maintenance Adopting a healthy lifestyle and getting preventive care can go a long way to promote health and wellness. Talk with your health care provider about what schedule of regular examinations is right for you. This is a good chance for you to check in with your provider about disease prevention and staying healthy. In between checkups, there are plenty of things you can do on your own. Experts have done a lot of research about which lifestyle changes and preventive measures are most likely to keep you healthy. Ask your health care provider for more information. WEIGHT AND DIET  Eat a healthy diet  Be sure to include plenty of vegetables, fruits, low-fat dairy products, and lean protein.  Do not eat a lot of foods high in solid fats, added sugars, or salt.  Get regular exercise. This is one of the most important things you can do for your health.  Most adults should exercise for at least 150 minutes each week. The exercise should increase your heart rate and make you sweat (moderate-intensity exercise).  Most adults should also do strengthening exercises at least twice a week. This is in addition to the moderate-intensity exercise.  Maintain a healthy weight  Body mass index (BMI) is a measurement that can be used to identify possible weight problems. It estimates body fat based on height and weight. Your health care provider can help determine your BMI and help you achieve or maintain a healthy weight.  For females 79 years of age and older:   A BMI below 18.5 is considered underweight.  A BMI of 18.5 to 24.9 is normal.  A BMI of 25 to 29.9 is considered overweight.  A BMI of 30 and above is considered  obese.  Watch levels of cholesterol and blood lipids  You should start having your blood tested for lipids and cholesterol at 76 years of age, then have this test every 5 years.  You may need to have your cholesterol levels checked more often if:  Your lipid or cholesterol levels are high.  You are older than 76 years of age.  You are at high risk for heart disease.  CANCER SCREENING   Lung Cancer  Lung cancer screening is recommended for adults 30-25 years old who are at high risk for lung cancer because of a history of smoking.  A yearly low-dose CT scan of the lungs is recommended for people who:  Currently smoke.  Have quit within the past 15 years.  Have at least a 30-pack-year history of smoking. A pack year is smoking an average of one pack of cigarettes a day for 1 year.  Yearly screening should continue until it has been 15 years since you quit.  Yearly screening should stop if you develop a health problem that would prevent you from having lung cancer treatment.  Breast Cancer  Practice breast self-awareness. This means understanding how your breasts normally appear and feel.  It also means doing regular breast self-exams. Let your health care provider know about any changes, no matter how small.  If you are in your 20s or 30s, you should have a clinical breast exam (CBE) by a health care provider every 1-3 years  as part of a regular health exam.  If you are 32 or older, have a CBE every year. Also consider having a breast X-ray (mammogram) every year.  If you have a family history of breast cancer, talk to your health care provider about genetic screening.  If you are at high risk for breast cancer, talk to your health care provider about having an MRI and a mammogram every year.  Breast cancer gene (BRCA) assessment is recommended for women who have family members with BRCA-related cancers. BRCA-related cancers  include:  Breast.  Ovarian.  Tubal.  Peritoneal cancers.  Results of the assessment will determine the need for genetic counseling and BRCA1 and BRCA2 testing. Cervical Cancer Routine pelvic examinations to screen for cervical cancer are no longer recommended for nonpregnant women who are considered low risk for cancer of the pelvic organs (ovaries, uterus, and vagina) and who do not have symptoms. A pelvic examination may be necessary if you have symptoms including those associated with pelvic infections. Ask your health care provider if a screening pelvic exam is right for you.   The Pap test is the screening test for cervical cancer for women who are considered at risk.  If you had a hysterectomy for a problem that was not cancer or a condition that could lead to cancer, then you no longer need Pap tests.  If you are older than 65 years, and you have had normal Pap tests for the past 10 years, you no longer need to have Pap tests.  If you have had past treatment for cervical cancer or a condition that could lead to cancer, you need Pap tests and screening for cancer for at least 20 years after your treatment.  If you no longer get a Pap test, assess your risk factors if they change (such as having a new sexual partner). This can affect whether you should start being screened again.  Some women have medical problems that increase their chance of getting cervical cancer. If this is the case for you, your health care provider may recommend more frequent screening and Pap tests.  The human papillomavirus (HPV) test is another test that may be used for cervical cancer screening. The HPV test looks for the virus that can cause cell changes in the cervix. The cells collected during the Pap test can be tested for HPV.  The HPV test can be used to screen women 28 years of age and older. Getting tested for HPV can extend the interval between normal Pap tests from three to five years.  An HPV  test also should be used to screen women of any age who have unclear Pap test results.  After 76 years of age, women should have HPV testing as often as Pap tests.  Colorectal Cancer  This type of cancer can be detected and often prevented.  Routine colorectal cancer screening usually begins at 76 years of age and continues through 76 years of age.  Your health care provider may recommend screening at an earlier age if you have risk factors for colon cancer.  Your health care provider may also recommend using home test kits to check for hidden blood in the stool.  A small camera at the end of a tube can be used to examine your colon directly (sigmoidoscopy or colonoscopy). This is done to check for the earliest forms of colorectal cancer.  Routine screening usually begins at age 60.  Direct examination of the colon should be repeated every  5-10 years through 76 years of age. However, you may need to be screened more often if early forms of precancerous polyps or small growths are found. Skin Cancer  Check your skin from head to toe regularly.  Tell your health care provider about any new moles or changes in moles, especially if there is a change in a mole's shape or color.  Also tell your health care provider if you have a mole that is larger than the size of a pencil eraser.  Always use sunscreen. Apply sunscreen liberally and repeatedly throughout the day.  Protect yourself by wearing long sleeves, pants, a wide-brimmed hat, and sunglasses whenever you are outside. HEART DISEASE, DIABETES, AND HIGH BLOOD PRESSURE   Have your blood pressure checked at least every 1-2 years. High blood pressure causes heart disease and increases the risk of stroke.  If you are between 2 years and 56 years old, ask your health care provider if you should take aspirin to prevent strokes.  Have regular diabetes screenings. This involves taking a blood sample to check your fasting blood sugar  level.  If you are at a normal weight and have a low risk for diabetes, have this test once every three years after 76 years of age.  If you are overweight and have a high risk for diabetes, consider being tested at a younger age or more often. PREVENTING INFECTION  Hepatitis B  If you have a higher risk for hepatitis B, you should be screened for this virus. You are considered at high risk for hepatitis B if:  You were born in a country where hepatitis B is common. Ask your health care provider which countries are considered high risk.  Your parents were born in a high-risk country, and you have not been immunized against hepatitis B (hepatitis B vaccine).  You have HIV or AIDS.  You use needles to inject street drugs.  You live with someone who has hepatitis B.  You have had sex with someone who has hepatitis B.  You get hemodialysis treatment.  You take certain medicines for conditions, including cancer, organ transplantation, and autoimmune conditions. Hepatitis C  Blood testing is recommended for:  Everyone born from [birth year] through 1967.  Anyone with known risk factors for hepatitis C. Sexually transmitted infections (STIs)  You should be screened for sexually transmitted infections (STIs) including gonorrhea and chlamydia if:  You are sexually active and are younger than 76 years of age.  You are older than 76 years of age and your health care provider tells you that you are at risk for this type of infection.  Your sexual activity has changed since you were last screened and you are at an increased risk for chlamydia or gonorrhea. Ask your health care provider if you are at risk.  If you do not have HIV, but are at risk, it may be recommended that you take a prescription medicine daily to prevent HIV infection. This is called pre-exposure prophylaxis (PrEP). You are considered at risk if:  You are sexually active and do not regularly use condoms or know the HIV status  of your partner(s).  You take drugs by injection.  You are sexually active with a partner who has HIV. Talk with your health care provider about whether you are at high risk of being infected with HIV. If you choose to begin PrEP, you should first be tested for HIV. You should then be tested every 3 months for as long as you  are taking PrEP.  PREGNANCY   If you are premenopausal and you may become pregnant, ask your health care provider about preconception counseling.  If you may become pregnant, take 400 to 800 micrograms (mcg) of folic acid every day.  If you want to prevent pregnancy, talk to your health care provider about birth control (contraception). OSTEOPOROSIS AND MENOPAUSE   Osteoporosis is a disease in which the bones lose minerals and strength with aging. This can result in serious bone fractures. Your risk for osteoporosis can be identified using a bone density scan.  If you are 74 years of age or older, or if you are at risk for osteoporosis and fractures, ask your health care provider if you should be screened.  Ask your health care provider whether you should take a calcium or vitamin D supplement to lower your risk for osteoporosis.  Menopause may have certain physical symptoms and risks.  Hormone replacement therapy may reduce some of these symptoms and risks. Talk to your health care provider about whether hormone replacement therapy is right for you.  HOME CARE INSTRUCTIONS   Schedule regular health, dental, and eye exams.  Stay current with your immunizations.   Do not use any tobacco products including cigarettes, chewing tobacco, or electronic cigarettes.  If you are pregnant, do not drink alcohol.  If you are breastfeeding, limit how much and how often you drink alcohol.  Limit alcohol intake to no more than 1 drink per day for nonpregnant women. One drink equals 12 ounces of beer, 5 ounces of wine, or 1 ounces of hard liquor.  Do not use street  drugs.  Do not share needles.  Ask your health care provider for help if you need support or information about quitting drugs.  Tell your health care provider if you often feel depressed.  Tell your health care provider if you have ever been abused or do not feel safe at home. Document Released: 03/10/2011 Document Revised: 01/09/2014 Document Reviewed: 07/27/2013 [facility] Patient Information 2015 [place], [place]. This information is not intended to replace advice given to you by your health care provider. Make sure you discuss any questions you have with your health care provider.

## 2015-01-18 NOTE — Telephone Encounter (Signed)
Spoke to daughter Shelle Iron([place]) and informed her that her mother's kidney function has decreased in almost half. She was informed that they need to follow up with their Nephrologist ( [name]). She will call today and make an appointment.    Note sent to [name] as well, informing him of most recent kidney function.

## 2015-01-19 ENCOUNTER — Telehealth: Payer: Self-pay | Admitting: Adult Health

## 2015-01-19 NOTE — Telephone Encounter (Signed)
Sherri Mays from [facility] is needing office notes and lab results for DOS 01/17/2015 sent to them for [name]. She has a doctor Monday 01/22/2015. The daughter told them that Molina's labs were high so they would like a copy faxed to them. The fax number is [phone] Attn: Sherri Mays

## 2015-01-19 NOTE — Telephone Encounter (Signed)
Ok per Ben[place] to fax office notes and labs to [facility].  Fax confirmation received.

## 2015-01-22 DIAGNOSIS — I129 Hypertensive chronic kidney disease with stage 1 through stage 4 chronic kidney disease, or unspecified chronic kidney disease: Secondary | ICD-10-CM | POA: Diagnosis not present

## 2015-01-22 DIAGNOSIS — N2581 Secondary hyperparathyroidism of renal origin: Secondary | ICD-10-CM | POA: Diagnosis not present

## 2015-01-22 DIAGNOSIS — N184 Chronic kidney disease, stage 4 (severe): Secondary | ICD-10-CM | POA: Diagnosis not present

## 2015-01-24 ENCOUNTER — Other Ambulatory Visit: Payer: Self-pay | Admitting: Nephrology

## 2015-01-24 DIAGNOSIS — N184 Chronic kidney disease, stage 4 (severe): Secondary | ICD-10-CM

## 2015-01-26 ENCOUNTER — Ambulatory Visit
Admission: RE | Admit: 2015-01-26 | Discharge: 2015-01-26 | Disposition: A | Discharge status: Home / Self Care | Payer: Medicare Other | Source: Ambulatory Visit | Attending: Nephrology | Admitting: Nephrology

## 2015-01-26 DIAGNOSIS — N281 Cyst of kidney, acquired: Secondary | ICD-10-CM | POA: Diagnosis not present

## 2015-01-26 DIAGNOSIS — N184 Chronic kidney disease, stage 4 (severe): Secondary | ICD-10-CM

## 2015-01-26 DIAGNOSIS — N189 Chronic kidney disease, unspecified: Secondary | ICD-10-CM | POA: Diagnosis not present

## 2015-01-30 DIAGNOSIS — N184 Chronic kidney disease, stage 4 (severe): Secondary | ICD-10-CM | POA: Diagnosis not present

## 2015-01-31 ENCOUNTER — Ambulatory Visit: Payer: Medicare Other | Admitting: Family Medicine

## 2015-02-02 ENCOUNTER — Ambulatory Visit: Payer: Medicare Other | Admitting: Podiatry

## 2015-02-21 DIAGNOSIS — N179 Acute kidney failure, unspecified: Secondary | ICD-10-CM | POA: Diagnosis not present

## 2015-02-22 DIAGNOSIS — I129 Hypertensive chronic kidney disease with stage 1 through stage 4 chronic kidney disease, or unspecified chronic kidney disease: Secondary | ICD-10-CM | POA: Diagnosis not present

## 2015-02-22 DIAGNOSIS — N2581 Secondary hyperparathyroidism of renal origin: Secondary | ICD-10-CM | POA: Diagnosis not present

## 2015-02-22 DIAGNOSIS — N184 Chronic kidney disease, stage 4 (severe): Secondary | ICD-10-CM | POA: Diagnosis not present

## 2015-03-01 DIAGNOSIS — N184 Chronic kidney disease, stage 4 (severe): Secondary | ICD-10-CM | POA: Diagnosis not present

## 2015-03-01 DIAGNOSIS — N2581 Secondary hyperparathyroidism of renal origin: Secondary | ICD-10-CM | POA: Diagnosis not present

## 2015-03-06 ENCOUNTER — Ambulatory Visit (INDEPENDENT_AMBULATORY_CARE_PROVIDER_SITE_OTHER): Payer: Medicare Other | Admitting: Podiatry

## 2015-03-06 DIAGNOSIS — M79673 Pain in unspecified foot: Secondary | ICD-10-CM

## 2015-03-06 DIAGNOSIS — B351 Tinea unguium: Secondary | ICD-10-CM

## 2015-03-06 NOTE — Progress Notes (Signed)
Patient ID: Sherri Mays, female   DOB: [birth year], 74 y.o.   MRN: [id number] HPI  Complaint:  Visit Type: Patient returns to my office for continued preventative foot care services. Complaint: Patient states" my nails have grown long and thick and become painful to walk and wear shoes" Patient has been diagnosed with DM with no vascular  complications. He presents for preventative foot care services. No changes to ROS  Podiatric Exam: Vascular: dorsalis pedis and posterior tibial pulses are negative. Capillary return is immediate. Temperature gradient is negative. Skin turgor WNL,   Sensorium: Normal Semmes Weinstein monofilament test. Normal tactile sensation bilaterally.  Nail Exam: Pt has thick disfigured discolored nails with subungual debris noted bilateral entire nail hallux through fifth toenails Ulcer Exam: There is no evidence of ulcer or pre-ulcerative changes or infection. Orthopedic Exam: Muscle tone and strength are WNL. No limitations in general ROM. No crepitus or effusions noted. Foot type and digits show no abnormalities. Bony prominences are unremarkable. Skin: No Porokeratosis. No infection or ulcers  Diagnosis:  Tinea unguium, Pain in right toe, pain in left toes  Treatment & Plan Procedures and Treatment: Consent by patient was obtained for treatment procedures. The patient understood the discussion of treatment and procedures well. All questions were answered thoroughly reviewed. Debridement of mycotic and hypertrophic toenails, 1 through 5 bilateral and clearing of subungual debris. No ulceration, no infection noted.  Return Visit-Office Procedure: Patient instructed to return to the office for a follow up visit 3 months for continued evaluation and treatment.

## 2015-03-08 ENCOUNTER — Other Ambulatory Visit: Payer: Self-pay | Admitting: Family

## 2015-03-20 DIAGNOSIS — N2581 Secondary hyperparathyroidism of renal origin: Secondary | ICD-10-CM | POA: Diagnosis not present

## 2015-03-20 DIAGNOSIS — N184 Chronic kidney disease, stage 4 (severe): Secondary | ICD-10-CM | POA: Diagnosis not present

## 2015-03-20 DIAGNOSIS — D631 Anemia in chronic kidney disease: Secondary | ICD-10-CM | POA: Diagnosis not present

## 2015-03-20 DIAGNOSIS — I129 Hypertensive chronic kidney disease with stage 1 through stage 4 chronic kidney disease, or unspecified chronic kidney disease: Secondary | ICD-10-CM | POA: Diagnosis not present

## 2015-03-26 DIAGNOSIS — N184 Chronic kidney disease, stage 4 (severe): Secondary | ICD-10-CM | POA: Diagnosis not present

## 2015-04-24 DIAGNOSIS — I129 Hypertensive chronic kidney disease with stage 1 through stage 4 chronic kidney disease, or unspecified chronic kidney disease: Secondary | ICD-10-CM | POA: Diagnosis not present

## 2015-04-24 DIAGNOSIS — N2581 Secondary hyperparathyroidism of renal origin: Secondary | ICD-10-CM | POA: Diagnosis not present

## 2015-04-24 DIAGNOSIS — N184 Chronic kidney disease, stage 4 (severe): Secondary | ICD-10-CM | POA: Diagnosis not present

## 2015-04-24 DIAGNOSIS — D631 Anemia in chronic kidney disease: Secondary | ICD-10-CM | POA: Diagnosis not present

## 2015-05-28 ENCOUNTER — Ambulatory Visit: Payer: Medicare Other | Admitting: Adult Health

## 2015-06-04 ENCOUNTER — Encounter: Payer: Self-pay | Admitting: Adult Health

## 2015-06-04 ENCOUNTER — Ambulatory Visit (INDEPENDENT_AMBULATORY_CARE_PROVIDER_SITE_OTHER): Payer: Medicare Other | Admitting: Adult Health

## 2015-06-04 VITALS — BP 156/88 | HR 96 | Temp 98.5°F | Ht 61.0 in | Wt 138.1 lb

## 2015-06-04 DIAGNOSIS — Z23 Encounter for immunization: Secondary | ICD-10-CM

## 2015-06-04 DIAGNOSIS — Z09 Encounter for follow-up examination after completed treatment for conditions other than malignant neoplasm: Secondary | ICD-10-CM

## 2015-06-04 NOTE — Progress Notes (Signed)
Pre visit review using our clinic review tool, if applicable. No additional management support is needed unless otherwise documented below in the visit note. 
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
## 2015-06-04 NOTE — Patient Instructions (Signed)
It was great seeing you again today!  Continue to follow up with the kidney doctors. They will manage your blood pressure and the medications that go along with that. If you need anything else, please let me know.   Follow up in January for a medicare wellness exam.

## 2015-06-12 ENCOUNTER — Ambulatory Visit (INDEPENDENT_AMBULATORY_CARE_PROVIDER_SITE_OTHER): Payer: Medicare Other | Admitting: Podiatry

## 2015-06-12 ENCOUNTER — Encounter: Payer: Self-pay | Admitting: Podiatry

## 2015-06-12 DIAGNOSIS — D631 Anemia in chronic kidney disease: Secondary | ICD-10-CM | POA: Diagnosis not present

## 2015-06-12 DIAGNOSIS — N2581 Secondary hyperparathyroidism of renal origin: Secondary | ICD-10-CM | POA: Diagnosis not present

## 2015-06-12 DIAGNOSIS — B351 Tinea unguium: Secondary | ICD-10-CM | POA: Diagnosis not present

## 2015-06-12 DIAGNOSIS — N184 Chronic kidney disease, stage 4 (severe): Secondary | ICD-10-CM | POA: Diagnosis not present

## 2015-06-12 DIAGNOSIS — M79673 Pain in unspecified foot: Secondary | ICD-10-CM

## 2015-06-12 DIAGNOSIS — I129 Hypertensive chronic kidney disease with stage 1 through stage 4 chronic kidney disease, or unspecified chronic kidney disease: Secondary | ICD-10-CM | POA: Diagnosis not present

## 2015-06-12 NOTE — Progress Notes (Signed)
Patient ID: Sherri Mays, female   DOB: 09/19/1940, 74 y.o.   MRN: [id number] HPI  Complaint:  Visit Type: Patient returns to my office for continued preventative foot care services. Complaint: Patient states" my nails have grown long and thick and become painful to walk and wear shoes" Patient has been diagnosed with DM with no vascular  complications. He presents for preventative foot care services. No changes to ROS  Podiatric Exam: Vascular: dorsalis pedis and posterior tibial pulses are negative. Capillary return is immediate. Temperature gradient is negative. Skin turgor WNL,   Sensorium: Normal Semmes Weinstein monofilament test. Normal tactile sensation bilaterally.  Nail Exam: Pt has thick disfigured discolored nails with subungual debris noted bilateral entire nail hallux through fifth toenails Ulcer Exam: There is no evidence of ulcer or pre-ulcerative changes or infection. Orthopedic Exam: Muscle tone and strength are WNL. No limitations in general ROM. No crepitus or effusions noted. Foot type and digits show no abnormalities. Bony prominences are unremarkable. Skin: No Porokeratosis. No infection or ulcers  Diagnosis:  Tinea unguium, Pain in right toe, pain in left toes  Treatment & Plan Procedures and Treatment: Consent by patient was obtained for treatment procedures. The patient understood the discussion of treatment and procedures well. All questions were answered thoroughly reviewed. Debridement of mycotic and hypertrophic toenails, 1 through 5 bilateral and clearing of subungual debris. No ulceration, no infection noted.  Return Visit-Office Procedure: Patient instructed to return to the office for a follow up visit 3 months for continued evaluation and treatment.  

## 2015-07-13 ENCOUNTER — Other Ambulatory Visit: Payer: Self-pay | Admitting: Adult Health

## 2015-09-12 ENCOUNTER — Ambulatory Visit: Payer: Medicare Other | Admitting: Podiatry

## 2015-10-02 DIAGNOSIS — N184 Chronic kidney disease, stage 4 (severe): Secondary | ICD-10-CM | POA: Diagnosis not present

## 2015-10-02 DIAGNOSIS — D631 Anemia in chronic kidney disease: Secondary | ICD-10-CM | POA: Diagnosis not present

## 2015-10-02 DIAGNOSIS — Z720 Tobacco use: Secondary | ICD-10-CM | POA: Diagnosis not present

## 2015-10-02 DIAGNOSIS — I129 Hypertensive chronic kidney disease with stage 1 through stage 4 chronic kidney disease, or unspecified chronic kidney disease: Secondary | ICD-10-CM | POA: Diagnosis not present

## 2015-10-02 DIAGNOSIS — N2581 Secondary hyperparathyroidism of renal origin: Secondary | ICD-10-CM | POA: Diagnosis not present

## 2015-10-16 ENCOUNTER — Other Ambulatory Visit (HOSPITAL_COMMUNITY): Payer: Self-pay | Admitting: *Deleted

## 2015-10-17 ENCOUNTER — Ambulatory Visit (HOSPITAL_COMMUNITY)
Admission: RE | Admit: 2015-10-17 | Discharge: 2015-10-17 | Disposition: A | Discharge status: Home / Self Care | Payer: Medicare Other | Source: Ambulatory Visit | Attending: Nephrology | Admitting: Nephrology

## 2015-10-17 DIAGNOSIS — D509 Iron deficiency anemia, unspecified: Secondary | ICD-10-CM | POA: Insufficient documentation

## 2015-10-17 MED ORDER — SODIUM CHLORIDE 0.9 % IV SOLN
510.0000 mg | INTRAVENOUS | Status: DC
  Administered 2015-10-17: 510 mg via INTRAVENOUS
  Filled 2015-10-17: qty 17

## 2015-10-17 NOTE — Discharge Instructions (Signed)

## 2015-10-22 ENCOUNTER — Inpatient Hospital Stay (HOSPITAL_COMMUNITY): Payer: Medicare Other

## 2015-10-22 ENCOUNTER — Inpatient Hospital Stay (HOSPITAL_COMMUNITY)
Admission: EM | Admit: 2015-10-22 | Discharge: 2015-10-23 | DRG: 683 | Disposition: A | Discharge status: Home / Self Care | Payer: Medicare Other | Attending: Internal Medicine | Admitting: Internal Medicine

## 2015-10-22 ENCOUNTER — Encounter (HOSPITAL_COMMUNITY): Payer: Self-pay | Admitting: *Deleted

## 2015-10-22 ENCOUNTER — Emergency Department (HOSPITAL_COMMUNITY): Payer: Medicare Other

## 2015-10-22 DIAGNOSIS — F17210 Nicotine dependence, cigarettes, uncomplicated: Secondary | ICD-10-CM | POA: Diagnosis present

## 2015-10-22 DIAGNOSIS — I10 Essential (primary) hypertension: Secondary | ICD-10-CM | POA: Diagnosis not present

## 2015-10-22 DIAGNOSIS — N179 Acute kidney failure, unspecified: Principal | ICD-10-CM

## 2015-10-22 DIAGNOSIS — D638 Anemia in other chronic diseases classified elsewhere: Secondary | ICD-10-CM | POA: Diagnosis present

## 2015-10-22 DIAGNOSIS — S069X0S Unspecified intracranial injury without loss of consciousness, sequela: Secondary | ICD-10-CM

## 2015-10-22 DIAGNOSIS — I120 Hypertensive chronic kidney disease with stage 5 chronic kidney disease or end stage renal disease: Secondary | ICD-10-CM | POA: Diagnosis present

## 2015-10-22 DIAGNOSIS — K5641 Fecal impaction: Secondary | ICD-10-CM | POA: Diagnosis present

## 2015-10-22 DIAGNOSIS — D631 Anemia in chronic kidney disease: Secondary | ICD-10-CM | POA: Diagnosis not present

## 2015-10-22 DIAGNOSIS — E872 Acidosis: Secondary | ICD-10-CM | POA: Diagnosis present

## 2015-10-22 DIAGNOSIS — E7800 Pure hypercholesterolemia, unspecified: Secondary | ICD-10-CM | POA: Diagnosis present

## 2015-10-22 DIAGNOSIS — X58XXXS Exposure to other specified factors, sequela: Secondary | ICD-10-CM | POA: Diagnosis present

## 2015-10-22 DIAGNOSIS — N185 Chronic kidney disease, stage 5: Secondary | ICD-10-CM | POA: Diagnosis present

## 2015-10-22 DIAGNOSIS — K5900 Constipation, unspecified: Secondary | ICD-10-CM | POA: Diagnosis not present

## 2015-10-22 DIAGNOSIS — R1030 Lower abdominal pain, unspecified: Secondary | ICD-10-CM | POA: Diagnosis not present

## 2015-10-22 DIAGNOSIS — S069X9A Unspecified intracranial injury with loss of consciousness of unspecified duration, initial encounter: Secondary | ICD-10-CM | POA: Diagnosis present

## 2015-10-22 DIAGNOSIS — E785 Hyperlipidemia, unspecified: Secondary | ICD-10-CM | POA: Diagnosis present

## 2015-10-22 DIAGNOSIS — N19 Unspecified kidney failure: Secondary | ICD-10-CM | POA: Diagnosis not present

## 2015-10-22 DIAGNOSIS — M109 Gout, unspecified: Secondary | ICD-10-CM

## 2015-10-22 DIAGNOSIS — S069XAA Unspecified intracranial injury with loss of consciousness status unknown, initial encounter: Secondary | ICD-10-CM | POA: Diagnosis present

## 2015-10-22 HISTORY — DX: Gout, unspecified: M10.9

## 2015-10-22 HISTORY — DX: Anemia, unspecified: D64.9

## 2015-10-22 HISTORY — DX: Type 2 diabetes mellitus without complications: E11.9

## 2015-10-22 HISTORY — DX: Unspecified dementia, unspecified severity, without behavioral disturbance, psychotic disturbance, mood disturbance, and anxiety: F03.90

## 2015-10-22 HISTORY — DX: Chronic kidney disease, unspecified: N18.9

## 2015-10-22 LAB — COMPREHENSIVE METABOLIC PANEL
ALBUMIN: 3.3 g/dL — AB (ref 3.5–5.0)
ALT: 22 U/L (ref 14–54)
ANION GAP: 16 — AB (ref 5–15)
AST: 27 U/L (ref 15–41)
Alkaline Phosphatase: 73 U/L (ref 38–126)
BUN: 91 mg/dL — ABNORMAL HIGH (ref 6–20)
CO2: 18 mmol/L — AB (ref 22–32)
Calcium: 9.6 mg/dL (ref 8.9–10.3)
Chloride: 105 mmol/L (ref 101–111)
Creatinine, Ser: 6.48 mg/dL — ABNORMAL HIGH (ref 0.44–1.00)
GFR calc Af Amer: 6 mL/min — ABNORMAL LOW (ref >60)
GFR calc non Af Amer: 6 mL/min — ABNORMAL LOW (ref >60)
GLUCOSE: 154 mg/dL — AB (ref 65–99)
POTASSIUM: 4 mmol/L (ref 3.5–5.1)
SODIUM: 139 mmol/L (ref 135–145)
Total Bilirubin: 0.6 mg/dL (ref 0.3–1.2)
Total Protein: 7.2 g/dL (ref 6.5–8.1)

## 2015-10-22 LAB — URINALYSIS, ROUTINE W REFLEX MICROSCOPIC
Bilirubin Urine: NEGATIVE
GLUCOSE, UA: 250 mg/dL — AB
Ketones, ur: NEGATIVE mg/dL
Nitrite: NEGATIVE
PH: 6 (ref 5.0–8.0)
Protein, ur: >300 mg/dL — AB
SPECIFIC GRAVITY, URINE: 1.015 (ref 1.005–1.030)

## 2015-10-22 LAB — CBC
HEMATOCRIT: 27.4 % — AB (ref 36.0–46.0)
HEMOGLOBIN: 9.5 g/dL — AB (ref 12.0–15.0)
MCH: 31.1 pg (ref 26.0–34.0)
MCHC: 34.7 g/dL (ref 30.0–36.0)
MCV: 89.8 fL (ref 78.0–100.0)
Platelets: 233 10*3/uL (ref 150–400)
RBC: 3.05 MIL/uL — ABNORMAL LOW (ref 3.87–5.11)
RDW: 16.5 % — ABNORMAL HIGH (ref 11.5–15.5)
WBC: 7.4 10*3/uL (ref 4.0–10.5)

## 2015-10-22 LAB — URINE MICROSCOPIC-ADD ON

## 2015-10-22 LAB — NA AND K (SODIUM & POTASSIUM), RAND UR
Potassium Urine: 31 mmol/L
SODIUM UR: 57 mmol/L

## 2015-10-22 LAB — CBG MONITORING, ED: Glucose-Capillary: 133 mg/dL — ABNORMAL HIGH (ref 65–99)

## 2015-10-22 MED ORDER — CALCITRIOL 0.25 MCG PO CAPS
0.2500 ug | ORAL_CAPSULE | Freq: Every day | ORAL | Status: DC
  Administered 2015-10-23 (×2): 0.25 ug via ORAL
  Filled 2015-10-22 (×2): qty 1

## 2015-10-22 MED ORDER — GUAIFENESIN-DM 100-10 MG/5ML PO SYRP: 5.0000 mL | ORAL_SOLUTION | ORAL | Status: DC | PRN

## 2015-10-22 MED ORDER — ALLOPURINOL 100 MG PO TABS
100.0000 mg | ORAL_TABLET | Freq: Every day | ORAL | Status: DC
  Administered 2015-10-23: 100 mg via ORAL
  Filled 2015-10-22: qty 1

## 2015-10-22 MED ORDER — HYDRALAZINE HCL 20 MG/ML IJ SOLN: 10.0000 mg | Freq: Four times a day (QID) | INTRAMUSCULAR | Status: DC | PRN

## 2015-10-22 MED ORDER — SODIUM CHLORIDE 0.9 % IV BOLUS (SEPSIS)
1000.0000 mL | Freq: Once | INTRAVENOUS | Status: AC
  Administered 2015-10-22: 1000 mL via INTRAVENOUS

## 2015-10-22 MED ORDER — ACETAMINOPHEN 650 MG RE SUPP: 650.0000 mg | Freq: Four times a day (QID) | RECTAL | Status: DC | PRN

## 2015-10-22 MED ORDER — ALBUTEROL SULFATE (2.5 MG/3ML) 0.083% IN NEBU: 2.5000 mg | INHALATION_SOLUTION | RESPIRATORY_TRACT | Status: DC | PRN

## 2015-10-22 MED ORDER — HEPARIN SODIUM (PORCINE) 5000 UNIT/ML IJ SOLN
5000.0000 [IU] | Freq: Three times a day (TID) | INTRAMUSCULAR | Status: DC
  Administered 2015-10-23 (×3): 5000 [IU] via SUBCUTANEOUS
  Filled 2015-10-22 (×3): qty 1

## 2015-10-22 MED ORDER — SODIUM CHLORIDE 0.9% FLUSH
3.0000 mL | Freq: Two times a day (BID) | INTRAVENOUS | Status: DC
  Administered 2015-10-22: 3 mL via INTRAVENOUS

## 2015-10-22 MED ORDER — SENNA 8.6 MG PO TABS
2.0000 | ORAL_TABLET | Freq: Every day | ORAL | Status: DC
  Administered 2015-10-23: 17.2 mg via ORAL
  Filled 2015-10-22: qty 2

## 2015-10-22 MED ORDER — SODIUM CHLORIDE 0.9 % IV SOLN
INTRAVENOUS | Status: DC
  Administered 2015-10-22: 22:00:00 via INTRAVENOUS

## 2015-10-22 MED ORDER — AMLODIPINE BESYLATE 10 MG PO TABS
10.0000 mg | ORAL_TABLET | Freq: Every day | ORAL | Status: DC
  Administered 2015-10-23 (×2): 10 mg via ORAL
  Filled 2015-10-22 (×2): qty 1

## 2015-10-22 MED ORDER — ACETAMINOPHEN 325 MG PO TABS
650.0000 mg | ORAL_TABLET | Freq: Four times a day (QID) | ORAL | Status: DC | PRN
Start: 2015-10-22 — End: 2015-10-23

## 2015-10-22 MED ORDER — ONDANSETRON HCL 4 MG/2ML IJ SOLN: 4.0000 mg | Freq: Four times a day (QID) | INTRAMUSCULAR | Status: DC | PRN

## 2015-10-22 MED ORDER — ATORVASTATIN CALCIUM 20 MG PO TABS
20.0000 mg | ORAL_TABLET | Freq: Every evening | ORAL | Status: DC
  Administered 2015-10-23: 20 mg via ORAL
  Filled 2015-10-22: qty 1

## 2015-10-22 MED ORDER — ONDANSETRON HCL 4 MG PO TABS: 4.0000 mg | ORAL_TABLET | Freq: Four times a day (QID) | ORAL | Status: DC | PRN

## 2015-10-22 MED ORDER — METOPROLOL SUCCINATE ER 50 MG PO TB24
50.0000 mg | ORAL_TABLET | Freq: Every day | ORAL | Status: DC
  Administered 2015-10-23: 50 mg via ORAL
  Filled 2015-10-22: qty 1

## 2015-10-22 MED ORDER — POLYETHYLENE GLYCOL 3350 17 G PO PACK
17.0000 g | PACK | Freq: Two times a day (BID) | ORAL | Status: DC
  Administered 2015-10-23 (×2): 17 g via ORAL
  Filled 2015-10-22 (×2): qty 1

## 2015-10-22 MED ORDER — HYDRALAZINE HCL 50 MG PO TABS
50.0000 mg | ORAL_TABLET | Freq: Three times a day (TID) | ORAL | Status: DC
  Administered 2015-10-23 (×3): 50 mg via ORAL
  Filled 2015-10-22 (×3): qty 1

## 2015-10-22 MED ORDER — SODIUM BICARBONATE 650 MG PO TABS
650.0000 mg | ORAL_TABLET | Freq: Two times a day (BID) | ORAL | Status: DC
  Administered 2015-10-23 (×2): 650 mg via ORAL
  Filled 2015-10-22 (×2): qty 1

## 2015-10-22 NOTE — ED Notes (Signed)
Bladder scanned pt. Bladder scanner read .

## 2015-10-22 NOTE — ED Provider Notes (Signed)
I saw and evaluated the patient, reviewed the resident's note and I agree with the findings and plan.   EKG Interpretation   Date/Time:  Monday October 22 2015 11:31:02 EST Ventricular Rate:  88 PR Interval:  132 QRS Duration: 76 QT Interval:  376 QTC Calculation: 454 R Axis:   53 Text Interpretation:  Normal sinus rhythm Cannot rule out Anterior infarct  , age undetermined Abnormal ECG No previous tracing Poor data quality  Confirmed by Maral Lampe  MD-J, Katelynn Heidler ([id number]) on 10/22/2015 3:04:06 PM        Pt presented to the ED with complaints of constipation and incontinence.  Labs notable for acute renal failure.  Will give fluids , check for bladder outlet obstruction.  Plan on admission for further evaluation and treatment.  Linwood Dibbles, MD 10/22/15 [phone]

## 2015-10-22 NOTE — ED Notes (Signed)
Admitting MD at bedside.
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
## 2015-10-22 NOTE — ED Notes (Signed)
Attempted report 
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
## 2015-10-22 NOTE — ED Notes (Signed)
Pt's urine output 275.

## 2015-10-22 NOTE — ED Notes (Signed)
Bladder scanner result of 615, MD aware, pt assisted to [facility] to attempt to urinate per MD. Will re-evaluate if I and O cath is needed.
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
## 2015-10-23 LAB — BASIC METABOLIC PANEL
Anion gap: 14 (ref 5–15)
BUN: 82 mg/dL — ABNORMAL HIGH (ref 6–20)
CALCIUM: 9 mg/dL (ref 8.9–10.3)
CO2: 17 mmol/L — AB (ref 22–32)
CREATININE: 5.83 mg/dL — AB (ref 0.44–1.00)
Chloride: 111 mmol/L (ref 101–111)
GFR, EST AFRICAN AMERICAN: 7 mL/min — AB (ref >60)
GFR, EST NON AFRICAN AMERICAN: 6 mL/min — AB (ref >60)
Glucose, Bld: 93 mg/dL (ref 65–99)
Potassium: 3.5 mmol/L (ref 3.5–5.1)
SODIUM: 142 mmol/L (ref 135–145)

## 2015-10-23 LAB — CBC
HCT: 24.4 % — ABNORMAL LOW (ref 36.0–46.0)
Hemoglobin: 8.3 g/dL — ABNORMAL LOW (ref 12.0–15.0)
MCH: 30.6 pg (ref 26.0–34.0)
MCHC: 34 g/dL (ref 30.0–36.0)
MCV: 90 fL (ref 78.0–100.0)
PLATELETS: 240 10*3/uL (ref 150–400)
RBC: 2.71 MIL/uL — AB (ref 3.87–5.11)
RDW: 16.6 % — ABNORMAL HIGH (ref 11.5–15.5)
WBC: 6.9 10*3/uL (ref 4.0–10.5)

## 2015-10-23 MED ORDER — SENNA 8.6 MG PO TABS: 2.0000 | ORAL_TABLET | Freq: Every day | ORAL | Status: AC

## 2015-10-23 MED ORDER — SODIUM BICARBONATE 650 MG PO TABS: 650.0000 mg | ORAL_TABLET | Freq: Two times a day (BID) | ORAL | Status: AC

## 2015-10-23 MED ORDER — METOPROLOL SUCCINATE ER 50 MG PO TB24: 50.0000 mg | ORAL_TABLET | Freq: Every day | ORAL | Status: AC

## 2015-10-23 MED ORDER — POLYETHYLENE GLYCOL 3350 17 G PO PACK: 17.0000 g | PACK | Freq: Every day | ORAL | Status: AC

## 2015-10-23 MED ORDER — HYDRALAZINE HCL 25 MG PO TABS: 50.0000 mg | ORAL_TABLET | Freq: Three times a day (TID) | ORAL | Status: AC

## 2015-10-23 MED ORDER — LACTULOSE 10 GM/15ML PO SOLN
30.0000 g | Freq: Every day | ORAL | Status: DC | PRN
  Administered 2015-10-23: 30 g via ORAL
  Filled 2015-10-23: qty 45

## 2015-10-23 NOTE — Progress Notes (Signed)
10/23/2015 11:18 AM  This is a late entry note.  Orders received for tap water enema.  Administered 500cc tap water enema to patient, she tolerated it well.  After a few minutes, patient's daughter came to the doorway and stated that she needed some help, something "wasn't right."  I went in to check on the patient, and noticed that she had a very small amount of liquid stool in her bedpan, and had a very large stool still stuck in her rectal area.  Manually disimpacted (per verbal order from MD) a small amount of formed stool.  Pt then started complaining of discomfort and stated she could not take the disimpaction anymore.  Dr. Jerral Ralph notified.  Orders then received for lactulose.  Administered per prn order.  Will monitor patient. Theadora Rama

## 2015-10-23 NOTE — Progress Notes (Signed)
10/23/2015 2:15 PM  Pt had large, formed bowel movement.  Will DC per previous order. Theadora Rama

## 2015-10-23 NOTE — Progress Notes (Signed)
10/23/2015 2:46 PM  Reviewed discharge instructions with patient and her daughter.  Covered DC instructions, diet, orders, medications, and follow up appointments.  Daughter asked questions appropriately and verbalized understanding upon completion.  IV and telemetry removed.  Pt escorted out via wheelchair accompanied by her family. Theadora Rama
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
## 2015-10-24 ENCOUNTER — Ambulatory Visit (HOSPITAL_COMMUNITY)
Admission: RE | Admit: 2015-10-24 | Discharge: 2015-10-24 | Disposition: A | Discharge status: Home / Self Care | Payer: Medicare Other | Source: Ambulatory Visit | Attending: Nephrology | Admitting: Nephrology

## 2015-10-24 ENCOUNTER — Telehealth: Payer: Self-pay

## 2015-10-24 NOTE — Telephone Encounter (Signed)
Transition Care Management Follow-up Telephone Call  How have you been since you were released from the hospital? she's doing better but is not her self    Do you understand why you were in the hospital? no   Do you understand the discharge instrcutions? no  Items Reviewed:  Medications reviewed: yes  Allergies reviewed: yes  Dietary changes reviewed: yes  Referrals reviewed: yes   Functional Questionnaire:   Activities of Daily Living (ADLs):   She states they are independent in the following: feeding States they require assistance with the following: bathing and hygiene, continence, toileting and dressing   Any transportation issues/concerns?: no   Any patient concerns? yes   Confirmed importance and date/time of follow-up visits scheduled: yes   Confirmed with patient if condition begins to worsen call PCP or go to the ER.  Patient was given the Call-a-Nurse line [phone]: yes  Pt has an appt with Kandee Keen on Tuesday 10/30/15 at 1:30

## 2015-10-30 ENCOUNTER — Telehealth: Payer: Self-pay

## 2015-10-30 ENCOUNTER — Ambulatory Visit (INDEPENDENT_AMBULATORY_CARE_PROVIDER_SITE_OTHER): Payer: Medicare Other | Admitting: Adult Health

## 2015-10-30 ENCOUNTER — Encounter: Payer: Self-pay | Admitting: Adult Health

## 2015-10-30 VITALS — BP 164/74 | Temp 98.3°F | Ht 61.0 in | Wt 127.7 lb

## 2015-10-30 DIAGNOSIS — Z09 Encounter for follow-up examination after completed treatment for conditions other than malignant neoplasm: Secondary | ICD-10-CM

## 2015-10-30 DIAGNOSIS — Z5189 Encounter for other specified aftercare: Secondary | ICD-10-CM

## 2015-10-30 DIAGNOSIS — K5641 Fecal impaction: Secondary | ICD-10-CM | POA: Diagnosis not present

## 2015-10-30 DIAGNOSIS — N179 Acute kidney failure, unspecified: Secondary | ICD-10-CM

## 2015-10-30 DIAGNOSIS — I10 Essential (primary) hypertension: Secondary | ICD-10-CM

## 2015-10-30 DIAGNOSIS — N185 Chronic kidney disease, stage 5: Secondary | ICD-10-CM | POA: Diagnosis not present

## 2015-10-30 LAB — COMPREHENSIVE METABOLIC PANEL
ALK PHOS: 65 U/L (ref 39–117)
ALT: 16 U/L (ref 0–35)
AST: 16 U/L (ref 0–37)
Albumin: 3.7 g/dL (ref 3.5–5.2)
BUN: 80 mg/dL — ABNORMAL HIGH (ref 6–23)
CALCIUM: 9.3 mg/dL (ref 8.4–10.5)
CHLORIDE: 105 meq/L (ref 96–112)
CO2: 23 meq/L (ref 19–32)
Creatinine, Ser: 5.95 mg/dL (ref 0.40–1.20)
GFR: 7.3 mL/min — AB (ref >60.00)
GLUCOSE: 211 mg/dL — AB (ref 70–99)
POTASSIUM: 4.6 meq/L (ref 3.5–5.1)
Sodium: 139 mEq/L (ref 135–145)
Total Bilirubin: 0.3 mg/dL (ref 0.2–1.2)
Total Protein: 7.3 g/dL (ref 6.0–8.3)

## 2015-10-30 NOTE — Patient Instructions (Signed)
It was great seeing you again!  Start taking 50 mg of Metoprolol   You can stop the Lipitor   You do not have any restrictions on your diet.   Follow up with me in 2-3 months or sooner if needed.

## 2015-10-30 NOTE — Telephone Encounter (Signed)
CRITICAL VALUE STICKER  CRITICAL VALUE: Creatinine 5.95 and GFR; 7.30  RECEIVER (INITALS): MA   DATE & TIME NOTIFIED: 7.21.2017 at 4:45 pm  MD NOTIFIED: Shirline Frees AGNP   TIME OF NOTIFICATION: 4:47 pm   RESPONSE:

## 2015-10-30 NOTE — Progress Notes (Signed)
Pre visit review using our clinic review tool, if applicable. No additional management support is needed unless otherwise documented below in the visit note. 
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
## 2015-10-31 ENCOUNTER — Encounter (HOSPITAL_COMMUNITY)
Admission: RE | Admit: 2015-10-31 | Discharge: 2015-10-31 | Disposition: A | Discharge status: Home / Self Care | Payer: Medicare Other | Source: Ambulatory Visit | Attending: Nephrology | Admitting: Nephrology

## 2015-10-31 DIAGNOSIS — D509 Iron deficiency anemia, unspecified: Secondary | ICD-10-CM | POA: Insufficient documentation

## 2015-10-31 DIAGNOSIS — N185 Chronic kidney disease, stage 5: Secondary | ICD-10-CM | POA: Insufficient documentation

## 2015-10-31 LAB — RENAL FUNCTION PANEL
ANION GAP: 12 (ref 5–15)
Albumin: 3 g/dL — ABNORMAL LOW (ref 3.5–5.0)
BUN: 76 mg/dL — ABNORMAL HIGH (ref 6–20)
CHLORIDE: 109 mmol/L (ref 101–111)
CO2: 20 mmol/L — AB (ref 22–32)
Calcium: 8.8 mg/dL — ABNORMAL LOW (ref 8.9–10.3)
Creatinine, Ser: 6.04 mg/dL — ABNORMAL HIGH (ref 0.44–1.00)
GFR, EST AFRICAN AMERICAN: 7 mL/min — AB (ref >60)
GFR, EST NON AFRICAN AMERICAN: 6 mL/min — AB (ref >60)
Glucose, Bld: 134 mg/dL — ABNORMAL HIGH (ref 65–99)
POTASSIUM: 4.2 mmol/L (ref 3.5–5.1)
Phosphorus: 5.2 mg/dL — ABNORMAL HIGH (ref 2.5–4.6)
Sodium: 141 mmol/L (ref 135–145)

## 2015-10-31 MED ORDER — SODIUM CHLORIDE 0.9 % IV SOLN
510.0000 mg | INTRAVENOUS | Status: DC
  Administered 2015-10-31: 510 mg via INTRAVENOUS
  Filled 2015-10-31: qty 17

## 2015-11-26 DIAGNOSIS — I129 Hypertensive chronic kidney disease with stage 1 through stage 4 chronic kidney disease, or unspecified chronic kidney disease: Secondary | ICD-10-CM | POA: Diagnosis not present

## 2015-11-26 DIAGNOSIS — D631 Anemia in chronic kidney disease: Secondary | ICD-10-CM | POA: Diagnosis not present

## 2015-11-26 DIAGNOSIS — N2581 Secondary hyperparathyroidism of renal origin: Secondary | ICD-10-CM | POA: Diagnosis not present

## 2015-11-26 DIAGNOSIS — N184 Chronic kidney disease, stage 4 (severe): Secondary | ICD-10-CM | POA: Diagnosis not present

## 2015-11-29 ENCOUNTER — Encounter: Payer: Self-pay | Admitting: Podiatry

## 2015-11-29 ENCOUNTER — Ambulatory Visit (INDEPENDENT_AMBULATORY_CARE_PROVIDER_SITE_OTHER): Payer: Medicare Other | Admitting: Podiatry

## 2015-11-29 DIAGNOSIS — B351 Tinea unguium: Secondary | ICD-10-CM

## 2015-11-29 DIAGNOSIS — M79673 Pain in unspecified foot: Secondary | ICD-10-CM | POA: Diagnosis not present

## 2015-11-29 NOTE — Progress Notes (Signed)
Patient ID: Sherri KindredJanet Schachter, female   DOB: Oct 16, 1940, 75 y.o.   MRN: [id number] HPI  Complaint:  Visit Type: Patient returns to my office for continued preventative foot care services. Complaint: Patient states" my nails have grown long and thick and become painful to walk and wear shoes" Patient has been diagnosed with DM with no vascular  complications. He presents for preventative foot care services. No changes to ROS  Podiatric Exam: Vascular: dorsalis pedis and posterior tibial pulses are negative. Capillary return is immediate. Temperature gradient is negative. Skin turgor WNL,   Sensorium: Normal Semmes Weinstein monofilament test. Normal tactile sensation bilaterally.  Nail Exam: Pt has thick disfigured discolored nails with subungual debris noted bilateral entire nail hallux through fifth toenails Ulcer Exam: There is no evidence of ulcer or pre-ulcerative changes or infection. Orthopedic Exam: Muscle tone and strength are WNL. No limitations in general ROM. No crepitus or effusions noted. Foot type and digits show no abnormalities. Bony prominences are unremarkable. Skin: No Porokeratosis. No infection or ulcers  Diagnosis:  Tinea unguium, Pain in right toe, pain in left toes  Treatment & Plan Procedures and Treatment: Consent by patient was obtained for treatment procedures. The patient understood the discussion of treatment and procedures well. All questions were answered thoroughly reviewed. Debridement of mycotic and hypertrophic toenails, 1 through 5 bilateral and clearing of subungual debris. No ulceration, no infection noted.  Return Visit-Office Procedure: Patient instructed to return to the office for a follow up visit 3 months for continued evaluation and treatment.   Helane GuntherGregory Carmine Carrozza DPM

## 2015-12-19 ENCOUNTER — Ambulatory Visit: Payer: Medicare Other | Admitting: Adult Health

## 2016-01-04 ENCOUNTER — Telehealth: Payer: Self-pay | Admitting: Adult Health

## 2016-01-04 ENCOUNTER — Ambulatory Visit (INDEPENDENT_AMBULATORY_CARE_PROVIDER_SITE_OTHER): Payer: Medicare Other | Admitting: Adult Health

## 2016-01-04 ENCOUNTER — Other Ambulatory Visit: Payer: Self-pay

## 2016-01-04 ENCOUNTER — Encounter: Payer: Self-pay | Admitting: Adult Health

## 2016-01-04 VITALS — BP 170/90 | Temp 98.0°F | Wt 125.8 lb

## 2016-01-04 DIAGNOSIS — Z0000 Encounter for general adult medical examination without abnormal findings: Secondary | ICD-10-CM

## 2016-01-04 NOTE — Telephone Encounter (Signed)
Patient needs refills on Metoprolol ER Succinate 25 mg tabs.  Patient uses [facility] [address].

## 2016-01-04 NOTE — Telephone Encounter (Signed)
Nephrology is taking care of this medication

## 2016-01-04 NOTE — Telephone Encounter (Signed)
Spoke to patient's son-in-law and he stated he will get his wife (patient's daughter) to call office back when she gets home
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
## 2016-01-04 NOTE — Telephone Encounter (Signed)
Med list has Metoprolol 50mg  - please advise.

## 2016-01-04 NOTE — Patient Instructions (Signed)
It was great seeing you again today!  You look great! Please continue doing what you are doing.   If you need anything, please let me know.   Lets follow up in 3 months.

## 2016-01-05 ENCOUNTER — Other Ambulatory Visit: Payer: Self-pay | Admitting: Adult Health

## 2016-01-08 NOTE — Telephone Encounter (Signed)
Left message for patient to return phone call.  

## 2016-01-08 NOTE — Telephone Encounter (Signed)
I do not see this medication on patient's current medication list. Ok to refill? 

## 2016-01-08 NOTE — Telephone Encounter (Signed)
We have d/c'd this medication. No need to refill

## 2016-01-09 NOTE — Telephone Encounter (Signed)
Left message for patient to return phone call.  

## 2016-01-09 NOTE — Telephone Encounter (Signed)
Patient's son in law notified - and will contact Nephrology for refill. Thanks.

## 2016-01-30 ENCOUNTER — Other Ambulatory Visit: Payer: Self-pay | Admitting: Adult Health

## 2016-02-19 DIAGNOSIS — N2581 Secondary hyperparathyroidism of renal origin: Secondary | ICD-10-CM | POA: Diagnosis not present

## 2016-02-19 DIAGNOSIS — I129 Hypertensive chronic kidney disease with stage 1 through stage 4 chronic kidney disease, or unspecified chronic kidney disease: Secondary | ICD-10-CM | POA: Diagnosis not present

## 2016-02-19 DIAGNOSIS — N184 Chronic kidney disease, stage 4 (severe): Secondary | ICD-10-CM | POA: Diagnosis not present

## 2016-02-19 DIAGNOSIS — D631 Anemia in chronic kidney disease: Secondary | ICD-10-CM | POA: Diagnosis not present

## 2016-02-28 ENCOUNTER — Ambulatory Visit: Payer: Medicare Other | Admitting: Podiatry

## 2016-03-09 ENCOUNTER — Other Ambulatory Visit: Payer: Self-pay | Admitting: Adult Health

## 2016-03-27 ENCOUNTER — Ambulatory Visit: Payer: Medicare Other | Admitting: Podiatry

## 2016-03-27 ENCOUNTER — Ambulatory Visit (INDEPENDENT_AMBULATORY_CARE_PROVIDER_SITE_OTHER): Payer: Medicare Other | Admitting: Podiatry

## 2016-03-27 ENCOUNTER — Encounter: Payer: Self-pay | Admitting: Podiatry

## 2016-03-27 DIAGNOSIS — M79673 Pain in unspecified foot: Secondary | ICD-10-CM

## 2016-03-27 DIAGNOSIS — B351 Tinea unguium: Secondary | ICD-10-CM

## 2016-03-27 NOTE — Progress Notes (Signed)
Patient ID: Sherri KindredJanet Fanning, female   DOB: [birth year], 75 y.o.   MRN: [id number] HPI  Complaint:  Visit Type: Patient returns to my office for continued preventative foot care services. Complaint: Patient states" my nails have grown long and thick and become painful to walk and wear shoes" Patient has been diagnosed with DM with no vascular  complications. He presents for preventative foot care services. No changes to ROS  Podiatric Exam: Vascular: dorsalis pedis and posterior tibial pulses are negative. Capillary return is immediate. Temperature gradient is negative. Skin turgor WNL,   Sensorium: Normal Semmes Weinstein monofilament test. Normal tactile sensation bilaterally.  Nail Exam: Pt has thick disfigured discolored nails with subungual debris noted bilateral entire nail hallux through fifth toenails Ulcer Exam: There is no evidence of ulcer or pre-ulcerative changes or infection. Orthopedic Exam: Muscle tone and strength are WNL. No limitations in general ROM. No crepitus or effusions noted. Foot type and digits show no abnormalities. Bony prominences are unremarkable. Skin: No Porokeratosis. No infection or ulcers  Diagnosis:  Tinea unguium, Pain in right toe, pain in left toes  Treatment & Plan Procedures and Treatment: Consent by patient was obtained for treatment procedures. The patient understood the discussion of treatment and procedures well. All questions were answered thoroughly reviewed. Debridement of mycotic and hypertrophic toenails, 1 through 5 bilateral and clearing of subungual debris. No ulceration, no infection noted.  Return Visit-Office Procedure: Patient instructed to return to the office for a follow up visit 3 months for continued evaluation and treatment.   Helane GuntherGregory Nabil Bubolz DPM

## 2016-04-03 ENCOUNTER — Other Ambulatory Visit: Payer: Self-pay | Admitting: Adult Health

## 2016-04-29 ENCOUNTER — Other Ambulatory Visit: Payer: Self-pay | Admitting: Adult Health

## 2016-05-02 ENCOUNTER — Other Ambulatory Visit: Payer: Self-pay | Admitting: Adult Health

## 2016-05-06 DIAGNOSIS — I129 Hypertensive chronic kidney disease with stage 1 through stage 4 chronic kidney disease, or unspecified chronic kidney disease: Secondary | ICD-10-CM | POA: Diagnosis not present

## 2016-05-06 DIAGNOSIS — D631 Anemia in chronic kidney disease: Secondary | ICD-10-CM | POA: Diagnosis not present

## 2016-05-06 DIAGNOSIS — N184 Chronic kidney disease, stage 4 (severe): Secondary | ICD-10-CM | POA: Diagnosis not present

## 2016-05-06 DIAGNOSIS — N2581 Secondary hyperparathyroidism of renal origin: Secondary | ICD-10-CM | POA: Diagnosis not present

## 2016-05-26 ENCOUNTER — Encounter (HOSPITAL_COMMUNITY): Payer: Medicare Other

## 2016-06-09 ENCOUNTER — Other Ambulatory Visit: Payer: Self-pay | Admitting: Adult Health

## 2016-06-10 ENCOUNTER — Ambulatory Visit (HOSPITAL_COMMUNITY)
Admission: RE | Admit: 2016-06-10 | Discharge: 2016-06-10 | Disposition: A | Discharge status: Home / Self Care | Payer: Medicare Other | Source: Ambulatory Visit | Attending: Nephrology | Admitting: Nephrology

## 2016-06-10 DIAGNOSIS — D638 Anemia in other chronic diseases classified elsewhere: Secondary | ICD-10-CM | POA: Insufficient documentation

## 2016-06-10 DIAGNOSIS — N184 Chronic kidney disease, stage 4 (severe): Secondary | ICD-10-CM | POA: Diagnosis not present

## 2016-06-10 LAB — POCT HEMOGLOBIN-HEMACUE: HEMOGLOBIN: 9.7 g/dL — AB (ref 12.0–15.0)

## 2016-06-10 MED ORDER — DARBEPOETIN ALFA 100 MCG/0.5ML IJ SOSY
PREFILLED_SYRINGE | INTRAMUSCULAR | Status: AC
  Filled 2016-06-10: qty 0.5

## 2016-06-10 MED ORDER — DARBEPOETIN ALFA 100 MCG/0.5ML IJ SOSY
100.0000 ug | PREFILLED_SYRINGE | Freq: Once | INTRAMUSCULAR | Status: AC
  Administered 2016-06-10: 100 ug via SUBCUTANEOUS

## 2016-06-10 NOTE — Discharge Instructions (Signed)
Darbepoetin Alfa injection What is this medicine? DARBEPOETIN ALFA (dar be POE e tin AL fa) helps your body make more red blood cells. It is used to treat anemia caused by chronic kidney failure and chemotherapy. This medicine may be used for other purposes; ask your health care provider or pharmacist if you have questions. What should I tell my health care provider before I take this medicine? They need to know if you have any of these conditions: -blood clotting disorders or history of blood clots -cancer patient not on chemotherapy -cystic fibrosis -heart disease, such as angina, heart failure, or a history of a heart attack -hemoglobin level of 12 g/dL or greater -high blood pressure -low levels of folate, iron, or vitamin B12 -seizures -an unusual or allergic reaction to darbepoetin, erythropoietin, albumin, hamster proteins, latex, other medicines, foods, dyes, or preservatives -pregnant or trying to get pregnant -breast-feeding How should I use this medicine? This medicine is for injection into a vein or under the skin. It is usually given by a health care professional in a hospital or clinic setting. If you get this medicine at home, you will be taught how to prepare and give this medicine. Do not shake the solution before you withdraw a dose. Use exactly as directed. Take your medicine at regular intervals. Do not take your medicine more often than directed. It is important that you put your used needles and syringes in a special sharps container. Do not put them in a trash can. If you do not have a sharps container, call your pharmacist or healthcare provider to get one. Talk to your pediatrician regarding the use of this medicine in children. While this medicine may be used in children as young as 1 year for selected conditions, precautions do apply. Overdosage: If you think you have taken too much of this medicine contact a poison control center or emergency room at once. NOTE:  This medicine is only for you. Do not share this medicine with others. What if I miss a dose? If you miss a dose, take it as soon as you can. If it is almost time for your next dose, take only that dose. Do not take double or extra doses. What may interact with this medicine? Do not take this medicine with any of the following medications: -epoetin alfa This list may not describe all possible interactions. Give your health care provider a list of all the medicines, herbs, non-prescription drugs, or dietary supplements you use. Also tell them if you smoke, drink alcohol, or use illegal drugs. Some items may interact with your medicine. What should I watch for while using this medicine? Visit your prescriber or health care professional for regular checks on your progress and for the needed blood tests and blood pressure measurements. It is especially important for the doctor to make sure your hemoglobin level is in the desired range, to limit the risk of potential side effects and to give you the best benefit. Keep all appointments for any recommended tests. Check your blood pressure as directed. Ask your doctor what your blood pressure should be and when you should contact him or her. As your body makes more red blood cells, you may need to take iron, folic acid, or vitamin B supplements. Ask your doctor or health care provider which products are right for you. If you have kidney disease continue dietary restrictions, even though this medication can make you feel better. Talk with your doctor or health care professional about the   foods you eat and the vitamins that you take. What side effects may I notice from receiving this medicine? Side effects that you should report to your doctor or health care professional as soon as possible: -allergic reactions like skin rash, itching or hives, swelling of the face, lips, or tongue -breathing problems -changes in vision -chest pain -confusion, trouble speaking  or understanding -feeling faint or lightheaded, falls -high blood pressure -muscle aches or pains -pain, swelling, warmth in the leg -rapid weight gain -severe headaches -sudden numbness or weakness of the face, arm or leg -trouble walking, dizziness, loss of balance or coordination -seizures (convulsions) -swelling of the ankles, feet, hands -unusually weak or tired Side effects that usually do not require medical attention (report to your doctor or health care professional if they continue or are bothersome): -diarrhea -fever, chills (flu-like symptoms) -headaches -nausea, vomiting -redness, stinging, or swelling at site where injected This list may not describe all possible side effects. Call your doctor for medical advice about side effects. You may report side effects to FDA at 1-800-FDA-1088. Where should I keep my medicine? Keep out of the reach of children. Store in a refrigerator between 2 and 8 degrees C (36 and 46 degrees F). Do not freeze. Do not shake. Throw away any unused portion if using a single-dose vial. Throw away any unused medicine after the expiration date. NOTE: This sheet is a summary. It may not cover all possible information. If you have questions about this medicine, talk to your doctor, pharmacist, or health care provider.    2016, Elsevier/Gold Standard. (2008-08-08 10:23:57)  

## 2016-06-12 ENCOUNTER — Encounter: Payer: Self-pay | Admitting: Nephrology

## 2016-06-12 ENCOUNTER — Ambulatory Visit: Payer: Medicare Other | Admitting: Podiatry

## 2016-06-23 ENCOUNTER — Other Ambulatory Visit (HOSPITAL_COMMUNITY): Payer: Self-pay | Admitting: *Deleted

## 2016-06-23 NOTE — Discharge Instructions (Signed)
Darbepoetin Alfa injection What is this medicine? DARBEPOETIN ALFA (dar be POE e tin AL fa) helps your body make more red blood cells. It is used to treat anemia caused by chronic kidney failure and chemotherapy. This medicine may be used for other purposes; ask your health care provider or pharmacist if you have questions. What should I tell my health care provider before I take this medicine? They need to know if you have any of these conditions: -blood clotting disorders or history of blood clots -cancer patient not on chemotherapy -cystic fibrosis -heart disease, such as angina, heart failure, or a history of a heart attack -hemoglobin level of 12 g/dL or greater -high blood pressure -low levels of folate, iron, or vitamin B12 -seizures -an unusual or allergic reaction to darbepoetin, erythropoietin, albumin, hamster proteins, latex, other medicines, foods, dyes, or preservatives -pregnant or trying to get pregnant -breast-feeding How should I use this medicine? This medicine is for injection into a vein or under the skin. It is usually given by a health care professional in a hospital or clinic setting. If you get this medicine at home, you will be taught how to prepare and give this medicine. Do not shake the solution before you withdraw a dose. Use exactly as directed. Take your medicine at regular intervals. Do not take your medicine more often than directed. It is important that you put your used needles and syringes in a special sharps container. Do not put them in a trash can. If you do not have a sharps container, call your pharmacist or healthcare provider to get one. Talk to your pediatrician regarding the use of this medicine in children. While this medicine may be used in children as young as 1 year for selected conditions, precautions do apply. Overdosage: If you think you have taken too much of this medicine contact a poison control center or emergency room at once. NOTE:  This medicine is only for you. Do not share this medicine with others. What if I miss a dose? If you miss a dose, take it as soon as you can. If it is almost time for your next dose, take only that dose. Do not take double or extra doses. What may interact with this medicine? Do not take this medicine with any of the following medications: -epoetin alfa This list may not describe all possible interactions. Give your health care provider a list of all the medicines, herbs, non-prescription drugs, or dietary supplements you use. Also tell them if you smoke, drink alcohol, or use illegal drugs. Some items may interact with your medicine. What should I watch for while using this medicine? Visit your prescriber or health care professional for regular checks on your progress and for the needed blood tests and blood pressure measurements. It is especially important for the doctor to make sure your hemoglobin level is in the desired range, to limit the risk of potential side effects and to give you the best benefit. Keep all appointments for any recommended tests. Check your blood pressure as directed. Ask your doctor what your blood pressure should be and when you should contact him or her. As your body makes more red blood cells, you may need to take iron, folic acid, or vitamin B supplements. Ask your doctor or health care provider which products are right for you. If you have kidney disease continue dietary restrictions, even though this medication can make you feel better. Talk with your doctor or health care professional about the   foods you eat and the vitamins that you take. What side effects may I notice from receiving this medicine? Side effects that you should report to your doctor or health care professional as soon as possible: -allergic reactions like skin rash, itching or hives, swelling of the face, lips, or tongue -breathing problems -changes in vision -chest pain -confusion, trouble speaking  or understanding -feeling faint or lightheaded, falls -high blood pressure -muscle aches or pains -pain, swelling, warmth in the leg -rapid weight gain -severe headaches -sudden numbness or weakness of the face, arm or leg -trouble walking, dizziness, loss of balance or coordination -seizures (convulsions) -swelling of the ankles, feet, hands -unusually weak or tired Side effects that usually do not require medical attention (report to your doctor or health care professional if they continue or are bothersome): -diarrhea -fever, chills (flu-like symptoms) -headaches -nausea, vomiting -redness, stinging, or swelling at site where injected This list may not describe all possible side effects. Call your doctor for medical advice about side effects. You may report side effects to FDA at 1-800-FDA-1088. Where should I keep my medicine? Keep out of the reach of children. Store in a refrigerator between 2 and 8 degrees C (36 and 46 degrees F). Do not freeze. Do not shake. Throw away any unused portion if using a single-dose vial. Throw away any unused medicine after the expiration date. NOTE: This sheet is a summary. It may not cover all possible information. If you have questions about this medicine, talk to your doctor, pharmacist, or health care provider.    2016, Elsevier/Gold Standard. (2008-08-08 10:23:57)  

## 2016-06-24 ENCOUNTER — Encounter (HOSPITAL_COMMUNITY)
Admission: RE | Admit: 2016-06-24 | Discharge: 2016-06-24 | Disposition: A | Discharge status: Home / Self Care | Payer: Medicare Other | Source: Ambulatory Visit | Attending: Nephrology | Admitting: Nephrology

## 2016-06-24 ENCOUNTER — Encounter (HOSPITAL_COMMUNITY): Payer: Self-pay

## 2016-06-24 DIAGNOSIS — N184 Chronic kidney disease, stage 4 (severe): Secondary | ICD-10-CM | POA: Diagnosis not present

## 2016-06-24 DIAGNOSIS — D638 Anemia in other chronic diseases classified elsewhere: Secondary | ICD-10-CM | POA: Diagnosis not present

## 2016-06-24 LAB — POCT HEMOGLOBIN-HEMACUE: HEMOGLOBIN: 9.9 g/dL — AB (ref 12.0–15.0)

## 2016-06-24 MED ORDER — DARBEPOETIN ALFA 100 MCG/0.5ML IJ SOSY
100.0000 ug | PREFILLED_SYRINGE | Freq: Once | INTRAMUSCULAR | Status: DC
  Administered 2016-06-24: 100 ug via SUBCUTANEOUS

## 2016-06-24 MED ORDER — DARBEPOETIN ALFA 100 MCG/0.5ML IJ SOSY
PREFILLED_SYRINGE | INTRAMUSCULAR | Status: AC
  Administered 2016-06-24: 100 ug via SUBCUTANEOUS
  Filled 2016-06-24: qty 0.5

## 2016-06-26 ENCOUNTER — Ambulatory Visit (INDEPENDENT_AMBULATORY_CARE_PROVIDER_SITE_OTHER): Payer: Medicare Other | Admitting: Podiatry

## 2016-06-26 ENCOUNTER — Encounter: Payer: Self-pay | Admitting: Podiatry

## 2016-06-26 VITALS — Ht 61.0 in | Wt 127.0 lb

## 2016-06-26 DIAGNOSIS — B351 Tinea unguium: Secondary | ICD-10-CM

## 2016-06-26 DIAGNOSIS — M79676 Pain in unspecified toe(s): Secondary | ICD-10-CM | POA: Diagnosis not present

## 2016-06-26 NOTE — Progress Notes (Signed)
Patient ID: Sherri Mays, female   DOB: 12/01/1940, 75 y.o.   MRN: [id number] HPI  Complaint:  Visit Type: Patient returns to my office for continued preventative foot care services. Complaint: Patient states" my nails have grown long and thick and become painful to walk and wear shoes" Patient has been diagnosed with DM with no vascular  complications. He presents for preventative foot care services. No changes to ROS  Podiatric Exam: Vascular: dorsalis pedis and posterior tibial pulses are negative. Capillary return is immediate. Temperature gradient is negative. Skin turgor WNL,   Sensorium: Normal Semmes Weinstein monofilament test. Normal tactile sensation bilaterally.  Nail Exam: Pt has thick disfigured discolored nails with subungual debris noted bilateral entire nail hallux through fifth toenails Ulcer Exam: There is no evidence of ulcer or pre-ulcerative changes or infection. Orthopedic Exam: Muscle tone and strength are WNL. No limitations in general ROM. No crepitus or effusions noted. Foot type and digits show no abnormalities. Bony prominences are unremarkable. Skin: No Porokeratosis. No infection or ulcers  Diagnosis:  Tinea unguium, Pain in right toe, pain in left toes  Treatment & Plan Procedures and Treatment: Consent by patient was obtained for treatment procedures. The patient understood the discussion of treatment and procedures well. All questions were answered thoroughly reviewed. Debridement of mycotic and hypertrophic toenails, 1 through 5 bilateral and clearing of subungual debris. No ulceration, no infection noted.  Return Visit-Office Procedure: Patient instructed to return to the office for a follow up visit 3 months for continued evaluation and treatment.   Riverlyn Kizziah DPM  

## 2016-07-02 DIAGNOSIS — I129 Hypertensive chronic kidney disease with stage 1 through stage 4 chronic kidney disease, or unspecified chronic kidney disease: Secondary | ICD-10-CM | POA: Diagnosis not present

## 2016-07-02 DIAGNOSIS — N2581 Secondary hyperparathyroidism of renal origin: Secondary | ICD-10-CM | POA: Diagnosis not present

## 2016-07-02 DIAGNOSIS — D631 Anemia in chronic kidney disease: Secondary | ICD-10-CM | POA: Diagnosis not present

## 2016-07-02 DIAGNOSIS — Z23 Encounter for immunization: Secondary | ICD-10-CM | POA: Diagnosis not present

## 2016-07-02 DIAGNOSIS — N184 Chronic kidney disease, stage 4 (severe): Secondary | ICD-10-CM | POA: Diagnosis not present

## 2016-07-07 ENCOUNTER — Other Ambulatory Visit (HOSPITAL_COMMUNITY): Payer: Self-pay | Admitting: *Deleted

## 2016-07-08 ENCOUNTER — Encounter (HOSPITAL_COMMUNITY)
Admission: RE | Admit: 2016-07-08 | Discharge: 2016-07-08 | Disposition: A | Discharge status: Home / Self Care | Payer: Medicare Other | Source: Ambulatory Visit | Attending: Nephrology | Admitting: Nephrology

## 2016-07-08 ENCOUNTER — Other Ambulatory Visit: Payer: Self-pay | Admitting: Adult Health

## 2016-07-08 DIAGNOSIS — N184 Chronic kidney disease, stage 4 (severe): Secondary | ICD-10-CM | POA: Diagnosis not present

## 2016-07-08 DIAGNOSIS — D638 Anemia in other chronic diseases classified elsewhere: Secondary | ICD-10-CM | POA: Diagnosis not present

## 2016-07-08 LAB — IRON AND TIBC
IRON: 32 ug/dL (ref 28–170)
Saturation Ratios: 13 % (ref 10.4–31.8)
TIBC: 245 ug/dL — ABNORMAL LOW (ref 250–450)
UIBC: 213 ug/dL

## 2016-07-08 LAB — POCT HEMOGLOBIN-HEMACUE: HEMOGLOBIN: 10.4 g/dL — AB (ref 12.0–15.0)

## 2016-07-08 LAB — FERRITIN: Ferritin: 501 ng/mL — ABNORMAL HIGH (ref 11–307)

## 2016-07-08 MED ORDER — DARBEPOETIN ALFA 100 MCG/0.5ML IJ SOSY: 100.0000 ug | PREFILLED_SYRINGE | Freq: Once | INTRAMUSCULAR | Status: DC

## 2016-07-08 MED ORDER — DARBEPOETIN ALFA 100 MCG/0.5ML IJ SOSY
PREFILLED_SYRINGE | INTRAMUSCULAR | Status: AC
Start: 2016-07-08 — End: 2016-07-08
  Administered 2016-07-08: 100 ug via SUBCUTANEOUS
  Filled 2016-07-08: qty 0.5

## 2016-07-08 NOTE — Discharge Instructions (Signed)
Excuse from Work, [employer], or Physical Activity _____[name]_____________________________________________ needs to be excused from: ___x__ Work  [name] Tuesday [facility]no[facility] care.    Health Care Provider Name (printed): ______Kathy Danella SensingFay , RN__________________________________  Health Care Provider (signature): ___________________________________________ Date: ____10/31/2017____________   This information is not intended to replace advice given to you by your health care provider. Make sure you discuss any questions you have with your health care provider.   Document Released: 02/18/2001 Document Revised: 09/15/2014 Document Reviewed: 03/27/2014 Elsevier Interactive Patient Education [employer]2016 Elsevier Inc.

## 2016-07-21 ENCOUNTER — Other Ambulatory Visit (HOSPITAL_COMMUNITY): Payer: Self-pay | Admitting: *Deleted

## 2016-07-22 ENCOUNTER — Encounter (HOSPITAL_COMMUNITY)
Admission: RE | Admit: 2016-07-22 | Discharge: 2016-07-22 | Disposition: A | Discharge status: Home / Self Care | Payer: Medicare Other | Source: Ambulatory Visit | Attending: Nephrology | Admitting: Nephrology

## 2016-07-22 DIAGNOSIS — N184 Chronic kidney disease, stage 4 (severe): Secondary | ICD-10-CM | POA: Diagnosis not present

## 2016-07-22 DIAGNOSIS — D638 Anemia in other chronic diseases classified elsewhere: Secondary | ICD-10-CM | POA: Diagnosis not present

## 2016-07-22 LAB — POCT HEMOGLOBIN-HEMACUE: HEMOGLOBIN: 10 g/dL — AB (ref 12.0–15.0)

## 2016-07-22 MED ORDER — DARBEPOETIN ALFA 100 MCG/0.5ML IJ SOSY
PREFILLED_SYRINGE | INTRAMUSCULAR | Status: AC
  Filled 2016-07-22: qty 0.5

## 2016-07-22 MED ORDER — DARBEPOETIN ALFA 100 MCG/0.5ML IJ SOSY
100.0000 ug | PREFILLED_SYRINGE | INTRAMUSCULAR | Status: DC
  Administered 2016-07-22: 100 ug via SUBCUTANEOUS

## 2016-07-27 ENCOUNTER — Other Ambulatory Visit: Payer: Self-pay | Admitting: Adult Health

## 2016-07-28 ENCOUNTER — Encounter: Payer: Self-pay | Admitting: Nephrology

## 2016-07-28 DIAGNOSIS — Z862 Personal history of diseases of the blood and blood-forming organs and certain disorders involving the immune mechanism: Secondary | ICD-10-CM | POA: Insufficient documentation

## 2016-07-28 DIAGNOSIS — N189 Chronic kidney disease, unspecified: Secondary | ICD-10-CM

## 2016-08-05 ENCOUNTER — Encounter (HOSPITAL_COMMUNITY)
Admission: RE | Admit: 2016-08-05 | Discharge: 2016-08-05 | Disposition: A | Discharge status: Home / Self Care | Payer: Medicare Other | Source: Ambulatory Visit | Attending: Nephrology | Admitting: Nephrology

## 2016-08-05 DIAGNOSIS — D638 Anemia in other chronic diseases classified elsewhere: Secondary | ICD-10-CM | POA: Diagnosis not present

## 2016-08-05 DIAGNOSIS — N184 Chronic kidney disease, stage 4 (severe): Secondary | ICD-10-CM | POA: Diagnosis not present

## 2016-08-05 LAB — POCT HEMOGLOBIN-HEMACUE: HEMOGLOBIN: 10.8 g/dL — AB (ref 12.0–15.0)

## 2016-08-05 LAB — IRON AND TIBC
Iron: 15 ug/dL — ABNORMAL LOW (ref 28–170)
SATURATION RATIOS: 7 % — AB (ref 10.4–31.8)
TIBC: 225 ug/dL — AB (ref 250–450)
UIBC: 210 ug/dL

## 2016-08-05 LAB — FERRITIN: Ferritin: 684 ng/mL — ABNORMAL HIGH (ref 11–307)

## 2016-08-05 MED ORDER — DARBEPOETIN ALFA 100 MCG/0.5ML IJ SOSY
PREFILLED_SYRINGE | INTRAMUSCULAR | Status: AC
  Filled 2016-08-05: qty 0.5

## 2016-08-05 MED ORDER — DARBEPOETIN ALFA 100 MCG/0.5ML IJ SOSY
100.0000 ug | PREFILLED_SYRINGE | Freq: Once | INTRAMUSCULAR | Status: AC
  Administered 2016-08-05: 100 ug via SUBCUTANEOUS

## 2016-08-11 ENCOUNTER — Encounter: Payer: Self-pay | Admitting: Family Medicine

## 2016-08-11 ENCOUNTER — Ambulatory Visit (INDEPENDENT_AMBULATORY_CARE_PROVIDER_SITE_OTHER): Payer: Medicare Other | Admitting: Family Medicine

## 2016-08-11 ENCOUNTER — Telehealth: Payer: Self-pay | Admitting: Adult Health

## 2016-08-11 VITALS — BP 164/82 | HR 90 | Temp 99.8°F | Ht 61.0 in

## 2016-08-11 DIAGNOSIS — K5900 Constipation, unspecified: Secondary | ICD-10-CM

## 2016-08-11 MED ORDER — GLYCERIN (LAXATIVE) 2.1 G RE SUPP: 1.0000 | Freq: Every day | RECTAL | 0 refills | Status: AC | PRN
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
## 2016-08-11 NOTE — Progress Notes (Signed)
Pre visit review using our clinic review tool, if applicable. No additional management support is needed unless otherwise documented below in the visit note. 

## 2016-08-11 NOTE — Patient Instructions (Signed)
BEFORE YOU LEAVE: -follow up: 30 minute follow up with PCP in 3-7 days  Glycerin suppository once daily as needed per instructions.  Mirilax one capful in cup of water 2 times daily  Metameucil once daily in the morning before breakfast  Seek care immediately if worsening, abdominal pain, bleeding, new concerns or if no bowel movement over the next few days.
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
## 2016-08-11 NOTE — Telephone Encounter (Signed)
Pt kept appt with Dr Selena BattenKim today.

## 2016-08-18 ENCOUNTER — Other Ambulatory Visit (HOSPITAL_COMMUNITY): Payer: Self-pay | Admitting: *Deleted

## 2016-08-19 ENCOUNTER — Ambulatory Visit (HOSPITAL_COMMUNITY)
Admission: RE | Admit: 2016-08-19 | Discharge: 2016-08-19 | Disposition: A | Discharge status: Home / Self Care | Payer: Medicare Other | Source: Ambulatory Visit | Attending: Nephrology | Admitting: Nephrology

## 2016-08-19 DIAGNOSIS — D509 Iron deficiency anemia, unspecified: Secondary | ICD-10-CM | POA: Insufficient documentation

## 2016-08-19 DIAGNOSIS — N184 Chronic kidney disease, stage 4 (severe): Secondary | ICD-10-CM | POA: Insufficient documentation

## 2016-08-19 DIAGNOSIS — D638 Anemia in other chronic diseases classified elsewhere: Secondary | ICD-10-CM | POA: Diagnosis present

## 2016-08-19 DIAGNOSIS — N189 Chronic kidney disease, unspecified: Secondary | ICD-10-CM

## 2016-08-19 DIAGNOSIS — Z862 Personal history of diseases of the blood and blood-forming organs and certain disorders involving the immune mechanism: Secondary | ICD-10-CM

## 2016-08-19 LAB — POCT HEMOGLOBIN-HEMACUE: HEMOGLOBIN: 10.6 g/dL — AB (ref 12.0–15.0)

## 2016-08-19 MED ORDER — CLONIDINE HCL 0.1 MG PO TABS: 0.1000 mg | ORAL_TABLET | Freq: Once | ORAL | Status: DC | PRN

## 2016-08-19 MED ORDER — DARBEPOETIN ALFA 100 MCG/0.5ML IJ SOSY
100.0000 ug | PREFILLED_SYRINGE | INTRAMUSCULAR | Status: DC
  Administered 2016-08-19: 100 ug via SUBCUTANEOUS

## 2016-08-19 MED ORDER — DARBEPOETIN ALFA 100 MCG/0.5ML IJ SOSY
PREFILLED_SYRINGE | INTRAMUSCULAR | Status: AC
  Administered 2016-08-19: 100 ug via SUBCUTANEOUS
  Filled 2016-08-19: qty 0.5

## 2016-08-19 MED ORDER — SODIUM CHLORIDE 0.9 % IV SOLN
510.0000 mg | INTRAVENOUS | Status: DC
  Administered 2016-08-19: 510 mg via INTRAVENOUS
  Filled 2016-08-19: qty 17

## 2016-08-21 ENCOUNTER — Encounter: Payer: Self-pay | Admitting: Adult Health

## 2016-08-21 ENCOUNTER — Ambulatory Visit (INDEPENDENT_AMBULATORY_CARE_PROVIDER_SITE_OTHER): Payer: Medicare Other | Admitting: Adult Health

## 2016-08-21 VITALS — BP 146/72 | HR 92 | Temp 98.4°F | Wt 114.2 lb

## 2016-08-21 DIAGNOSIS — K5900 Constipation, unspecified: Secondary | ICD-10-CM

## 2016-08-21 DIAGNOSIS — N185 Chronic kidney disease, stage 5: Secondary | ICD-10-CM

## 2016-08-21 NOTE — Progress Notes (Signed)
Pre visit review using our clinic review tool, if applicable. No additional management support is needed unless otherwise documented below in the visit note. 
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
## 2016-08-25 ENCOUNTER — Other Ambulatory Visit: Payer: Self-pay | Admitting: Adult Health

## 2016-08-26 ENCOUNTER — Ambulatory Visit (HOSPITAL_COMMUNITY)
Admission: RE | Admit: 2016-08-26 | Discharge: 2016-08-26 | Disposition: A | Discharge status: Home / Self Care | Payer: Medicare Other | Source: Ambulatory Visit | Attending: Nephrology | Admitting: Nephrology

## 2016-09-02 ENCOUNTER — Ambulatory Visit (HOSPITAL_COMMUNITY)
Admission: RE | Admit: 2016-09-02 | Discharge: 2016-09-02 | Disposition: A | Discharge status: Home / Self Care | Payer: Medicare Other | Source: Ambulatory Visit | Attending: Nephrology | Admitting: Nephrology

## 2016-09-02 DIAGNOSIS — D509 Iron deficiency anemia, unspecified: Secondary | ICD-10-CM | POA: Diagnosis not present

## 2016-09-02 DIAGNOSIS — D638 Anemia in other chronic diseases classified elsewhere: Secondary | ICD-10-CM | POA: Insufficient documentation

## 2016-09-02 DIAGNOSIS — N184 Chronic kidney disease, stage 4 (severe): Secondary | ICD-10-CM | POA: Diagnosis not present

## 2016-09-02 DIAGNOSIS — Z862 Personal history of diseases of the blood and blood-forming organs and certain disorders involving the immune mechanism: Secondary | ICD-10-CM | POA: Diagnosis not present

## 2016-09-02 DIAGNOSIS — N189 Chronic kidney disease, unspecified: Secondary | ICD-10-CM

## 2016-09-02 MED ORDER — DARBEPOETIN ALFA 100 MCG/0.5ML IJ SOSY
PREFILLED_SYRINGE | INTRAMUSCULAR | Status: AC
  Administered 2016-09-02: 14:00:00 100 ug via SUBCUTANEOUS
  Filled 2016-09-02: qty 0.5

## 2016-09-02 MED ORDER — DARBEPOETIN ALFA 100 MCG/0.5ML IJ SOSY
100.0000 ug | PREFILLED_SYRINGE | INTRAMUSCULAR | Status: DC
  Administered 2016-09-02: 100 ug via SUBCUTANEOUS

## 2016-09-02 MED ORDER — SODIUM CHLORIDE 0.9 % IV SOLN
510.0000 mg | INTRAVENOUS | Status: DC
  Administered 2016-09-02: 510 mg via INTRAVENOUS
  Filled 2016-09-02: qty 17

## 2016-09-03 LAB — POCT HEMOGLOBIN-HEMACUE: HEMOGLOBIN: 11.7 g/dL — AB (ref 12.0–15.0)

## 2016-09-07 DIAGNOSIS — E1159 Type 2 diabetes mellitus with other circulatory complications: Secondary | ICD-10-CM | POA: Diagnosis not present

## 2016-09-07 DIAGNOSIS — N186 End stage renal disease: Secondary | ICD-10-CM | POA: Diagnosis not present

## 2016-09-07 DIAGNOSIS — R609 Edema, unspecified: Secondary | ICD-10-CM | POA: Diagnosis not present

## 2016-09-07 DIAGNOSIS — R634 Abnormal weight loss: Secondary | ICD-10-CM | POA: Diagnosis not present

## 2016-09-07 DIAGNOSIS — R630 Anorexia: Secondary | ICD-10-CM | POA: Diagnosis not present

## 2016-09-07 DIAGNOSIS — I10 Essential (primary) hypertension: Secondary | ICD-10-CM | POA: Diagnosis not present

## 2016-09-07 DIAGNOSIS — M109 Gout, unspecified: Secondary | ICD-10-CM | POA: Diagnosis not present

## 2016-09-07 DIAGNOSIS — E785 Hyperlipidemia, unspecified: Secondary | ICD-10-CM | POA: Diagnosis not present

## 2016-09-07 DIAGNOSIS — G309 Alzheimer's disease, unspecified: Secondary | ICD-10-CM | POA: Diagnosis not present

## 2016-09-07 DIAGNOSIS — D609 Acquired pure red cell aplasia, unspecified: Secondary | ICD-10-CM | POA: Diagnosis not present

## 2016-09-08 DIAGNOSIS — I10 Essential (primary) hypertension: Secondary | ICD-10-CM | POA: Diagnosis not present

## 2016-09-08 DIAGNOSIS — N186 End stage renal disease: Secondary | ICD-10-CM | POA: Diagnosis not present

## 2016-09-08 DIAGNOSIS — E1159 Type 2 diabetes mellitus with other circulatory complications: Secondary | ICD-10-CM | POA: Diagnosis not present

## 2016-09-08 DIAGNOSIS — G309 Alzheimer's disease, unspecified: Secondary | ICD-10-CM | POA: Diagnosis not present

## 2016-09-08 DIAGNOSIS — R609 Edema, unspecified: Secondary | ICD-10-CM | POA: Diagnosis not present

## 2016-09-08 DIAGNOSIS — E785 Hyperlipidemia, unspecified: Secondary | ICD-10-CM | POA: Diagnosis not present

## 2016-09-08 DIAGNOSIS — R634 Abnormal weight loss: Secondary | ICD-10-CM | POA: Diagnosis not present

## 2016-09-08 DIAGNOSIS — D609 Acquired pure red cell aplasia, unspecified: Secondary | ICD-10-CM | POA: Diagnosis not present

## 2016-09-08 DIAGNOSIS — R630 Anorexia: Secondary | ICD-10-CM | POA: Diagnosis not present

## 2016-09-08 DIAGNOSIS — M109 Gout, unspecified: Secondary | ICD-10-CM | POA: Diagnosis not present

## 2016-09-09 DIAGNOSIS — G309 Alzheimer's disease, unspecified: Secondary | ICD-10-CM | POA: Diagnosis not present

## 2016-09-09 DIAGNOSIS — E785 Hyperlipidemia, unspecified: Secondary | ICD-10-CM | POA: Diagnosis not present

## 2016-09-09 DIAGNOSIS — N186 End stage renal disease: Secondary | ICD-10-CM | POA: Diagnosis not present

## 2016-09-09 DIAGNOSIS — I10 Essential (primary) hypertension: Secondary | ICD-10-CM | POA: Diagnosis not present

## 2016-09-09 DIAGNOSIS — E1159 Type 2 diabetes mellitus with other circulatory complications: Secondary | ICD-10-CM | POA: Diagnosis not present

## 2016-09-09 DIAGNOSIS — R609 Edema, unspecified: Secondary | ICD-10-CM | POA: Diagnosis not present

## 2016-09-10 DIAGNOSIS — E785 Hyperlipidemia, unspecified: Secondary | ICD-10-CM | POA: Diagnosis not present

## 2016-09-10 DIAGNOSIS — R609 Edema, unspecified: Secondary | ICD-10-CM | POA: Diagnosis not present

## 2016-09-10 DIAGNOSIS — N186 End stage renal disease: Secondary | ICD-10-CM | POA: Diagnosis not present

## 2016-09-10 DIAGNOSIS — G309 Alzheimer's disease, unspecified: Secondary | ICD-10-CM | POA: Diagnosis not present

## 2016-09-10 DIAGNOSIS — E1159 Type 2 diabetes mellitus with other circulatory complications: Secondary | ICD-10-CM | POA: Diagnosis not present

## 2016-09-10 DIAGNOSIS — I10 Essential (primary) hypertension: Secondary | ICD-10-CM | POA: Diagnosis not present

## 2016-09-11 DIAGNOSIS — I10 Essential (primary) hypertension: Secondary | ICD-10-CM | POA: Diagnosis not present

## 2016-09-11 DIAGNOSIS — G309 Alzheimer's disease, unspecified: Secondary | ICD-10-CM | POA: Diagnosis not present

## 2016-09-11 DIAGNOSIS — E1159 Type 2 diabetes mellitus with other circulatory complications: Secondary | ICD-10-CM | POA: Diagnosis not present

## 2016-09-11 DIAGNOSIS — E785 Hyperlipidemia, unspecified: Secondary | ICD-10-CM | POA: Diagnosis not present

## 2016-09-11 DIAGNOSIS — R609 Edema, unspecified: Secondary | ICD-10-CM | POA: Diagnosis not present

## 2016-09-11 DIAGNOSIS — N186 End stage renal disease: Secondary | ICD-10-CM | POA: Diagnosis not present

## 2016-09-16 DIAGNOSIS — I10 Essential (primary) hypertension: Secondary | ICD-10-CM | POA: Diagnosis not present

## 2016-09-16 DIAGNOSIS — G309 Alzheimer's disease, unspecified: Secondary | ICD-10-CM | POA: Diagnosis not present

## 2016-09-16 DIAGNOSIS — R609 Edema, unspecified: Secondary | ICD-10-CM | POA: Diagnosis not present

## 2016-09-16 DIAGNOSIS — E785 Hyperlipidemia, unspecified: Secondary | ICD-10-CM | POA: Diagnosis not present

## 2016-09-16 DIAGNOSIS — N186 End stage renal disease: Secondary | ICD-10-CM | POA: Diagnosis not present

## 2016-09-16 DIAGNOSIS — E1159 Type 2 diabetes mellitus with other circulatory complications: Secondary | ICD-10-CM | POA: Diagnosis not present

## 2016-09-17 DIAGNOSIS — I10 Essential (primary) hypertension: Secondary | ICD-10-CM | POA: Diagnosis not present

## 2016-09-17 DIAGNOSIS — R609 Edema, unspecified: Secondary | ICD-10-CM | POA: Diagnosis not present

## 2016-09-17 DIAGNOSIS — E785 Hyperlipidemia, unspecified: Secondary | ICD-10-CM | POA: Diagnosis not present

## 2016-09-17 DIAGNOSIS — N186 End stage renal disease: Secondary | ICD-10-CM | POA: Diagnosis not present

## 2016-09-17 DIAGNOSIS — E1159 Type 2 diabetes mellitus with other circulatory complications: Secondary | ICD-10-CM | POA: Diagnosis not present

## 2016-09-17 DIAGNOSIS — G309 Alzheimer's disease, unspecified: Secondary | ICD-10-CM | POA: Diagnosis not present

## 2016-09-23 ENCOUNTER — Encounter (HOSPITAL_COMMUNITY)
Admission: RE | Admit: 2016-09-23 | Discharge: 2016-09-23 | Disposition: A | Discharge status: Home / Self Care | Source: Ambulatory Visit | Attending: Nephrology | Admitting: Nephrology

## 2016-09-23 DIAGNOSIS — N189 Chronic kidney disease, unspecified: Secondary | ICD-10-CM

## 2016-09-23 DIAGNOSIS — N184 Chronic kidney disease, stage 4 (severe): Secondary | ICD-10-CM | POA: Insufficient documentation

## 2016-09-23 DIAGNOSIS — D638 Anemia in other chronic diseases classified elsewhere: Secondary | ICD-10-CM | POA: Insufficient documentation

## 2016-09-23 DIAGNOSIS — Z862 Personal history of diseases of the blood and blood-forming organs and certain disorders involving the immune mechanism: Secondary | ICD-10-CM

## 2016-09-23 LAB — POCT HEMOGLOBIN-HEMACUE: Hemoglobin: 10.8 g/dL — ABNORMAL LOW (ref 12.0–15.0)

## 2016-09-23 MED ORDER — DARBEPOETIN ALFA 100 MCG/0.5ML IJ SOSY
100.0000 ug | PREFILLED_SYRINGE | INTRAMUSCULAR | Status: DC
  Administered 2016-09-23: 100 ug via SUBCUTANEOUS

## 2016-09-23 MED ORDER — DARBEPOETIN ALFA 100 MCG/0.5ML IJ SOSY
PREFILLED_SYRINGE | INTRAMUSCULAR | Status: AC
  Filled 2016-09-23: qty 0.5

## 2016-09-25 ENCOUNTER — Ambulatory Visit: Payer: Medicare Other | Admitting: Podiatry

## 2016-10-03 DIAGNOSIS — E785 Hyperlipidemia, unspecified: Secondary | ICD-10-CM | POA: Diagnosis not present

## 2016-10-03 DIAGNOSIS — R609 Edema, unspecified: Secondary | ICD-10-CM | POA: Diagnosis not present

## 2016-10-03 DIAGNOSIS — N186 End stage renal disease: Secondary | ICD-10-CM | POA: Diagnosis not present

## 2016-10-03 DIAGNOSIS — G309 Alzheimer's disease, unspecified: Secondary | ICD-10-CM | POA: Diagnosis not present

## 2016-10-03 DIAGNOSIS — I10 Essential (primary) hypertension: Secondary | ICD-10-CM | POA: Diagnosis not present

## 2016-10-03 DIAGNOSIS — E1159 Type 2 diabetes mellitus with other circulatory complications: Secondary | ICD-10-CM | POA: Diagnosis not present

## 2016-10-06 DIAGNOSIS — I10 Essential (primary) hypertension: Secondary | ICD-10-CM | POA: Diagnosis not present

## 2016-10-06 DIAGNOSIS — E785 Hyperlipidemia, unspecified: Secondary | ICD-10-CM | POA: Diagnosis not present

## 2016-10-06 DIAGNOSIS — E1159 Type 2 diabetes mellitus with other circulatory complications: Secondary | ICD-10-CM | POA: Diagnosis not present

## 2016-10-06 DIAGNOSIS — R609 Edema, unspecified: Secondary | ICD-10-CM | POA: Diagnosis not present

## 2016-10-06 DIAGNOSIS — N186 End stage renal disease: Secondary | ICD-10-CM | POA: Diagnosis not present

## 2016-10-06 DIAGNOSIS — G309 Alzheimer's disease, unspecified: Secondary | ICD-10-CM | POA: Diagnosis not present

## 2016-10-07 ENCOUNTER — Encounter (HOSPITAL_COMMUNITY)
Admission: RE | Admit: 2016-10-07 | Discharge: 2016-10-07 | Disposition: A | Discharge status: Home / Self Care | Source: Ambulatory Visit | Attending: Nephrology | Admitting: Nephrology

## 2016-10-07 DIAGNOSIS — Z862 Personal history of diseases of the blood and blood-forming organs and certain disorders involving the immune mechanism: Secondary | ICD-10-CM

## 2016-10-07 DIAGNOSIS — N184 Chronic kidney disease, stage 4 (severe): Secondary | ICD-10-CM | POA: Diagnosis not present

## 2016-10-07 DIAGNOSIS — N189 Chronic kidney disease, unspecified: Principal | ICD-10-CM

## 2016-10-07 LAB — IRON AND TIBC
Iron: 39 ug/dL (ref 28–170)
Saturation Ratios: 17 % (ref 10.4–31.8)
TIBC: 234 ug/dL — ABNORMAL LOW (ref 250–450)
UIBC: 195 ug/dL

## 2016-10-07 LAB — FERRITIN: FERRITIN: 711 ng/mL — AB (ref 11–307)

## 2016-10-07 MED ORDER — DARBEPOETIN ALFA 100 MCG/0.5ML IJ SOSY
PREFILLED_SYRINGE | INTRAMUSCULAR | Status: AC
  Filled 2016-10-07: qty 0.5

## 2016-10-07 MED ORDER — DARBEPOETIN ALFA 100 MCG/0.5ML IJ SOSY
100.0000 ug | PREFILLED_SYRINGE | INTRAMUSCULAR | Status: DC
  Administered 2016-10-07: 100 ug via SUBCUTANEOUS

## 2016-10-08 LAB — POCT HEMOGLOBIN-HEMACUE: HEMOGLOBIN: 10.7 g/dL — AB (ref 12.0–15.0)

## 2016-10-09 ENCOUNTER — Other Ambulatory Visit: Payer: Self-pay | Admitting: Adult Health

## 2016-10-09 DIAGNOSIS — M109 Gout, unspecified: Secondary | ICD-10-CM | POA: Diagnosis not present

## 2016-10-09 DIAGNOSIS — E785 Hyperlipidemia, unspecified: Secondary | ICD-10-CM | POA: Diagnosis not present

## 2016-10-09 DIAGNOSIS — D609 Acquired pure red cell aplasia, unspecified: Secondary | ICD-10-CM | POA: Diagnosis not present

## 2016-10-09 DIAGNOSIS — E1159 Type 2 diabetes mellitus with other circulatory complications: Secondary | ICD-10-CM | POA: Diagnosis not present

## 2016-10-09 DIAGNOSIS — R634 Abnormal weight loss: Secondary | ICD-10-CM | POA: Diagnosis not present

## 2016-10-09 DIAGNOSIS — G309 Alzheimer's disease, unspecified: Secondary | ICD-10-CM | POA: Diagnosis not present

## 2016-10-09 DIAGNOSIS — I10 Essential (primary) hypertension: Secondary | ICD-10-CM | POA: Diagnosis not present

## 2016-10-09 DIAGNOSIS — R609 Edema, unspecified: Secondary | ICD-10-CM | POA: Diagnosis not present

## 2016-10-09 DIAGNOSIS — R630 Anorexia: Secondary | ICD-10-CM | POA: Diagnosis not present

## 2016-10-09 DIAGNOSIS — N186 End stage renal disease: Secondary | ICD-10-CM | POA: Diagnosis not present

## 2016-10-13 DIAGNOSIS — R609 Edema, unspecified: Secondary | ICD-10-CM | POA: Diagnosis not present

## 2016-10-13 DIAGNOSIS — G309 Alzheimer's disease, unspecified: Secondary | ICD-10-CM | POA: Diagnosis not present

## 2016-10-13 DIAGNOSIS — I10 Essential (primary) hypertension: Secondary | ICD-10-CM | POA: Diagnosis not present

## 2016-10-13 DIAGNOSIS — E785 Hyperlipidemia, unspecified: Secondary | ICD-10-CM | POA: Diagnosis not present

## 2016-10-13 DIAGNOSIS — E1159 Type 2 diabetes mellitus with other circulatory complications: Secondary | ICD-10-CM | POA: Diagnosis not present

## 2016-10-13 DIAGNOSIS — N186 End stage renal disease: Secondary | ICD-10-CM | POA: Diagnosis not present

## 2016-10-14 DIAGNOSIS — E1159 Type 2 diabetes mellitus with other circulatory complications: Secondary | ICD-10-CM | POA: Diagnosis not present

## 2016-10-14 DIAGNOSIS — E785 Hyperlipidemia, unspecified: Secondary | ICD-10-CM | POA: Diagnosis not present

## 2016-10-14 DIAGNOSIS — R609 Edema, unspecified: Secondary | ICD-10-CM | POA: Diagnosis not present

## 2016-10-14 DIAGNOSIS — G309 Alzheimer's disease, unspecified: Secondary | ICD-10-CM | POA: Diagnosis not present

## 2016-10-14 DIAGNOSIS — N186 End stage renal disease: Secondary | ICD-10-CM | POA: Diagnosis not present

## 2016-10-14 DIAGNOSIS — I10 Essential (primary) hypertension: Secondary | ICD-10-CM | POA: Diagnosis not present

## 2016-10-16 DIAGNOSIS — G309 Alzheimer's disease, unspecified: Secondary | ICD-10-CM | POA: Diagnosis not present

## 2016-10-16 DIAGNOSIS — E1159 Type 2 diabetes mellitus with other circulatory complications: Secondary | ICD-10-CM | POA: Diagnosis not present

## 2016-10-16 DIAGNOSIS — R609 Edema, unspecified: Secondary | ICD-10-CM | POA: Diagnosis not present

## 2016-10-16 DIAGNOSIS — E785 Hyperlipidemia, unspecified: Secondary | ICD-10-CM | POA: Diagnosis not present

## 2016-10-16 DIAGNOSIS — N186 End stage renal disease: Secondary | ICD-10-CM | POA: Diagnosis not present

## 2016-10-16 DIAGNOSIS — I10 Essential (primary) hypertension: Secondary | ICD-10-CM | POA: Diagnosis not present

## 2016-10-20 DIAGNOSIS — R609 Edema, unspecified: Secondary | ICD-10-CM | POA: Diagnosis not present

## 2016-10-20 DIAGNOSIS — I10 Essential (primary) hypertension: Secondary | ICD-10-CM | POA: Diagnosis not present

## 2016-10-20 DIAGNOSIS — E1159 Type 2 diabetes mellitus with other circulatory complications: Secondary | ICD-10-CM | POA: Diagnosis not present

## 2016-10-20 DIAGNOSIS — E785 Hyperlipidemia, unspecified: Secondary | ICD-10-CM | POA: Diagnosis not present

## 2016-10-20 DIAGNOSIS — N186 End stage renal disease: Secondary | ICD-10-CM | POA: Diagnosis not present

## 2016-10-20 DIAGNOSIS — G309 Alzheimer's disease, unspecified: Secondary | ICD-10-CM | POA: Diagnosis not present

## 2016-10-21 ENCOUNTER — Encounter (HOSPITAL_COMMUNITY)
Admission: RE | Admit: 2016-10-21 | Discharge: 2016-10-21 | Disposition: A | Discharge status: Home / Self Care | Source: Ambulatory Visit | Attending: Nephrology | Admitting: Nephrology

## 2016-10-21 DIAGNOSIS — Z862 Personal history of diseases of the blood and blood-forming organs and certain disorders involving the immune mechanism: Secondary | ICD-10-CM

## 2016-10-21 DIAGNOSIS — N184 Chronic kidney disease, stage 4 (severe): Secondary | ICD-10-CM | POA: Diagnosis present

## 2016-10-21 DIAGNOSIS — N189 Chronic kidney disease, unspecified: Secondary | ICD-10-CM

## 2016-10-21 DIAGNOSIS — D638 Anemia in other chronic diseases classified elsewhere: Secondary | ICD-10-CM | POA: Insufficient documentation

## 2016-10-21 LAB — POCT HEMOGLOBIN-HEMACUE: Hemoglobin: 10.7 g/dL — ABNORMAL LOW (ref 12.0–15.0)

## 2016-10-21 MED ORDER — DARBEPOETIN ALFA 100 MCG/0.5ML IJ SOSY
100.0000 ug | PREFILLED_SYRINGE | INTRAMUSCULAR | Status: DC
  Administered 2016-10-21: 14:00:00 100 ug via SUBCUTANEOUS

## 2016-10-21 MED ORDER — DARBEPOETIN ALFA 100 MCG/0.5ML IJ SOSY
PREFILLED_SYRINGE | INTRAMUSCULAR | Status: AC
  Filled 2016-10-21: qty 0.5

## 2016-10-22 DIAGNOSIS — I10 Essential (primary) hypertension: Secondary | ICD-10-CM | POA: Diagnosis not present

## 2016-10-22 DIAGNOSIS — G309 Alzheimer's disease, unspecified: Secondary | ICD-10-CM | POA: Diagnosis not present

## 2016-10-22 DIAGNOSIS — E785 Hyperlipidemia, unspecified: Secondary | ICD-10-CM | POA: Diagnosis not present

## 2016-10-22 DIAGNOSIS — E1159 Type 2 diabetes mellitus with other circulatory complications: Secondary | ICD-10-CM | POA: Diagnosis not present

## 2016-10-22 DIAGNOSIS — N186 End stage renal disease: Secondary | ICD-10-CM | POA: Diagnosis not present

## 2016-10-22 DIAGNOSIS — R609 Edema, unspecified: Secondary | ICD-10-CM | POA: Diagnosis not present

## 2016-10-24 ENCOUNTER — Other Ambulatory Visit: Payer: Self-pay | Admitting: Adult Health

## 2016-10-27 DIAGNOSIS — R609 Edema, unspecified: Secondary | ICD-10-CM | POA: Diagnosis not present

## 2016-10-27 DIAGNOSIS — E785 Hyperlipidemia, unspecified: Secondary | ICD-10-CM | POA: Diagnosis not present

## 2016-10-27 DIAGNOSIS — G309 Alzheimer's disease, unspecified: Secondary | ICD-10-CM | POA: Diagnosis not present

## 2016-10-27 DIAGNOSIS — N186 End stage renal disease: Secondary | ICD-10-CM | POA: Diagnosis not present

## 2016-10-27 DIAGNOSIS — E1159 Type 2 diabetes mellitus with other circulatory complications: Secondary | ICD-10-CM | POA: Diagnosis not present

## 2016-10-27 DIAGNOSIS — I10 Essential (primary) hypertension: Secondary | ICD-10-CM | POA: Diagnosis not present

## 2016-10-29 DIAGNOSIS — E785 Hyperlipidemia, unspecified: Secondary | ICD-10-CM | POA: Diagnosis not present

## 2016-10-29 DIAGNOSIS — G309 Alzheimer's disease, unspecified: Secondary | ICD-10-CM | POA: Diagnosis not present

## 2016-10-29 DIAGNOSIS — E1159 Type 2 diabetes mellitus with other circulatory complications: Secondary | ICD-10-CM | POA: Diagnosis not present

## 2016-10-29 DIAGNOSIS — R609 Edema, unspecified: Secondary | ICD-10-CM | POA: Diagnosis not present

## 2016-10-29 DIAGNOSIS — N186 End stage renal disease: Secondary | ICD-10-CM | POA: Diagnosis not present

## 2016-10-29 DIAGNOSIS — I10 Essential (primary) hypertension: Secondary | ICD-10-CM | POA: Diagnosis not present

## 2016-10-30 ENCOUNTER — Telehealth: Payer: Self-pay | Admitting: Adult Health

## 2016-10-30 DIAGNOSIS — G309 Alzheimer's disease, unspecified: Secondary | ICD-10-CM | POA: Diagnosis not present

## 2016-10-30 DIAGNOSIS — N186 End stage renal disease: Secondary | ICD-10-CM | POA: Diagnosis not present

## 2016-10-30 DIAGNOSIS — E1159 Type 2 diabetes mellitus with other circulatory complications: Secondary | ICD-10-CM | POA: Diagnosis not present

## 2016-10-30 DIAGNOSIS — I10 Essential (primary) hypertension: Secondary | ICD-10-CM | POA: Diagnosis not present

## 2016-10-30 DIAGNOSIS — R609 Edema, unspecified: Secondary | ICD-10-CM | POA: Diagnosis not present

## 2016-10-30 DIAGNOSIS — E785 Hyperlipidemia, unspecified: Secondary | ICD-10-CM | POA: Diagnosis not present

## 2016-10-30 NOTE — Telephone Encounter (Signed)
Her BP has been high ever since they have been assigned to this patient. It's been staying aroung 150/100. She's got bilateral edema on her lower extremis with current laxates dose 80 mg twice a day. Daughter is wondering if she needs to be seen by [name] or by [name].

## 2016-10-30 NOTE — Telephone Encounter (Signed)
This should be addressed by Nephrology since they are handling these medications

## 2016-10-30 NOTE — Telephone Encounter (Signed)
See below & advise 

## 2016-10-31 DIAGNOSIS — N186 End stage renal disease: Secondary | ICD-10-CM | POA: Diagnosis not present

## 2016-10-31 DIAGNOSIS — G309 Alzheimer's disease, unspecified: Secondary | ICD-10-CM | POA: Diagnosis not present

## 2016-10-31 DIAGNOSIS — E785 Hyperlipidemia, unspecified: Secondary | ICD-10-CM | POA: Diagnosis not present

## 2016-10-31 DIAGNOSIS — E1159 Type 2 diabetes mellitus with other circulatory complications: Secondary | ICD-10-CM | POA: Diagnosis not present

## 2016-10-31 DIAGNOSIS — R609 Edema, unspecified: Secondary | ICD-10-CM | POA: Diagnosis not present

## 2016-10-31 DIAGNOSIS — I10 Essential (primary) hypertension: Secondary | ICD-10-CM | POA: Diagnosis not present

## 2016-10-31 NOTE — Telephone Encounter (Signed)
Left message for Sherri Mays from Hospice to return phone call.

## 2016-11-03 DIAGNOSIS — E1159 Type 2 diabetes mellitus with other circulatory complications: Secondary | ICD-10-CM | POA: Diagnosis not present

## 2016-11-03 DIAGNOSIS — I10 Essential (primary) hypertension: Secondary | ICD-10-CM | POA: Diagnosis not present

## 2016-11-03 DIAGNOSIS — R609 Edema, unspecified: Secondary | ICD-10-CM | POA: Diagnosis not present

## 2016-11-03 DIAGNOSIS — G309 Alzheimer's disease, unspecified: Secondary | ICD-10-CM | POA: Diagnosis not present

## 2016-11-03 DIAGNOSIS — N186 End stage renal disease: Secondary | ICD-10-CM | POA: Diagnosis not present

## 2016-11-03 DIAGNOSIS — E785 Hyperlipidemia, unspecified: Secondary | ICD-10-CM | POA: Diagnosis not present

## 2016-11-03 NOTE — Telephone Encounter (Signed)
Sherri AbedLindsay called today because she hasn't received a call back from Thursday yet. I told her that there was a return call 10/31/16 from [facility] and she said that she didn't receive it. She is needing to talk with [name] or [name] because [name] is the attending provider.

## 2016-11-04 ENCOUNTER — Encounter (HOSPITAL_COMMUNITY)
Admission: RE | Admit: 2016-11-04 | Discharge: 2016-11-04 | Disposition: A | Discharge status: Home / Self Care | Source: Ambulatory Visit | Attending: Nephrology | Admitting: Nephrology

## 2016-11-04 DIAGNOSIS — N189 Chronic kidney disease, unspecified: Principal | ICD-10-CM

## 2016-11-04 DIAGNOSIS — N184 Chronic kidney disease, stage 4 (severe): Secondary | ICD-10-CM | POA: Diagnosis not present

## 2016-11-04 DIAGNOSIS — Z862 Personal history of diseases of the blood and blood-forming organs and certain disorders involving the immune mechanism: Secondary | ICD-10-CM

## 2016-11-04 LAB — POCT HEMOGLOBIN-HEMACUE: Hemoglobin: 10.7 g/dL — ABNORMAL LOW (ref 12.0–15.0)

## 2016-11-04 MED ORDER — DARBEPOETIN ALFA 100 MCG/0.5ML IJ SOSY
100.0000 ug | PREFILLED_SYRINGE | INTRAMUSCULAR | Status: DC
  Administered 2016-11-04: 100 ug via SUBCUTANEOUS

## 2016-11-04 MED ORDER — DARBEPOETIN ALFA 100 MCG/0.5ML IJ SOSY
PREFILLED_SYRINGE | INTRAMUSCULAR | Status: AC
  Filled 2016-11-04: qty 0.5

## 2016-11-04 NOTE — Telephone Encounter (Signed)
Lillia AbedLindsay notified that patient needs to be seen by [name], since he is handling these medications for patient. Lillia AbedLindsay verbalized understanding.

## 2016-11-06 DIAGNOSIS — R634 Abnormal weight loss: Secondary | ICD-10-CM | POA: Diagnosis not present

## 2016-11-06 DIAGNOSIS — E785 Hyperlipidemia, unspecified: Secondary | ICD-10-CM | POA: Diagnosis not present

## 2016-11-06 DIAGNOSIS — G309 Alzheimer's disease, unspecified: Secondary | ICD-10-CM | POA: Diagnosis not present

## 2016-11-06 DIAGNOSIS — I10 Essential (primary) hypertension: Secondary | ICD-10-CM | POA: Diagnosis not present

## 2016-11-06 DIAGNOSIS — R609 Edema, unspecified: Secondary | ICD-10-CM | POA: Diagnosis not present

## 2016-11-06 DIAGNOSIS — E1159 Type 2 diabetes mellitus with other circulatory complications: Secondary | ICD-10-CM | POA: Diagnosis not present

## 2016-11-06 DIAGNOSIS — D609 Acquired pure red cell aplasia, unspecified: Secondary | ICD-10-CM | POA: Diagnosis not present

## 2016-11-06 DIAGNOSIS — R630 Anorexia: Secondary | ICD-10-CM | POA: Diagnosis not present

## 2016-11-06 DIAGNOSIS — N186 End stage renal disease: Secondary | ICD-10-CM | POA: Diagnosis not present

## 2016-11-06 DIAGNOSIS — M109 Gout, unspecified: Secondary | ICD-10-CM | POA: Diagnosis not present

## 2016-11-10 DIAGNOSIS — E1159 Type 2 diabetes mellitus with other circulatory complications: Secondary | ICD-10-CM | POA: Diagnosis not present

## 2016-11-10 DIAGNOSIS — N186 End stage renal disease: Secondary | ICD-10-CM | POA: Diagnosis not present

## 2016-11-10 DIAGNOSIS — I10 Essential (primary) hypertension: Secondary | ICD-10-CM | POA: Diagnosis not present

## 2016-11-10 DIAGNOSIS — R609 Edema, unspecified: Secondary | ICD-10-CM | POA: Diagnosis not present

## 2016-11-10 DIAGNOSIS — E785 Hyperlipidemia, unspecified: Secondary | ICD-10-CM | POA: Diagnosis not present

## 2016-11-10 DIAGNOSIS — G309 Alzheimer's disease, unspecified: Secondary | ICD-10-CM | POA: Diagnosis not present

## 2016-11-13 DIAGNOSIS — N186 End stage renal disease: Secondary | ICD-10-CM | POA: Diagnosis not present

## 2016-11-13 DIAGNOSIS — E1159 Type 2 diabetes mellitus with other circulatory complications: Secondary | ICD-10-CM | POA: Diagnosis not present

## 2016-11-13 DIAGNOSIS — R609 Edema, unspecified: Secondary | ICD-10-CM | POA: Diagnosis not present

## 2016-11-13 DIAGNOSIS — E785 Hyperlipidemia, unspecified: Secondary | ICD-10-CM | POA: Diagnosis not present

## 2016-11-13 DIAGNOSIS — G309 Alzheimer's disease, unspecified: Secondary | ICD-10-CM | POA: Diagnosis not present

## 2016-11-13 DIAGNOSIS — I10 Essential (primary) hypertension: Secondary | ICD-10-CM | POA: Diagnosis not present

## 2016-11-14 DIAGNOSIS — E1159 Type 2 diabetes mellitus with other circulatory complications: Secondary | ICD-10-CM | POA: Diagnosis not present

## 2016-11-14 DIAGNOSIS — N186 End stage renal disease: Secondary | ICD-10-CM | POA: Diagnosis not present

## 2016-11-14 DIAGNOSIS — G309 Alzheimer's disease, unspecified: Secondary | ICD-10-CM | POA: Diagnosis not present

## 2016-11-14 DIAGNOSIS — E785 Hyperlipidemia, unspecified: Secondary | ICD-10-CM | POA: Diagnosis not present

## 2016-11-14 DIAGNOSIS — I10 Essential (primary) hypertension: Secondary | ICD-10-CM | POA: Diagnosis not present

## 2016-11-14 DIAGNOSIS — R609 Edema, unspecified: Secondary | ICD-10-CM | POA: Diagnosis not present

## 2016-11-17 DIAGNOSIS — R609 Edema, unspecified: Secondary | ICD-10-CM | POA: Diagnosis not present

## 2016-11-17 DIAGNOSIS — G309 Alzheimer's disease, unspecified: Secondary | ICD-10-CM | POA: Diagnosis not present

## 2016-11-17 DIAGNOSIS — E785 Hyperlipidemia, unspecified: Secondary | ICD-10-CM | POA: Diagnosis not present

## 2016-11-17 DIAGNOSIS — N186 End stage renal disease: Secondary | ICD-10-CM | POA: Diagnosis not present

## 2016-11-17 DIAGNOSIS — I10 Essential (primary) hypertension: Secondary | ICD-10-CM | POA: Diagnosis not present

## 2016-11-17 DIAGNOSIS — E1159 Type 2 diabetes mellitus with other circulatory complications: Secondary | ICD-10-CM | POA: Diagnosis not present

## 2016-11-18 ENCOUNTER — Encounter (HOSPITAL_COMMUNITY): Payer: Medicare Other

## 2016-11-20 DIAGNOSIS — R609 Edema, unspecified: Secondary | ICD-10-CM | POA: Diagnosis not present

## 2016-11-20 DIAGNOSIS — I10 Essential (primary) hypertension: Secondary | ICD-10-CM | POA: Diagnosis not present

## 2016-11-20 DIAGNOSIS — G309 Alzheimer's disease, unspecified: Secondary | ICD-10-CM | POA: Diagnosis not present

## 2016-11-20 DIAGNOSIS — E1159 Type 2 diabetes mellitus with other circulatory complications: Secondary | ICD-10-CM | POA: Diagnosis not present

## 2016-11-20 DIAGNOSIS — N186 End stage renal disease: Secondary | ICD-10-CM | POA: Diagnosis not present

## 2016-11-20 DIAGNOSIS — E785 Hyperlipidemia, unspecified: Secondary | ICD-10-CM | POA: Diagnosis not present

## 2016-11-21 ENCOUNTER — Encounter (HOSPITAL_COMMUNITY)
Admission: RE | Admit: 2016-11-21 | Discharge: 2016-11-21 | Disposition: A | Discharge status: Home / Self Care | Source: Ambulatory Visit | Attending: Nephrology | Admitting: Nephrology

## 2016-11-21 DIAGNOSIS — D638 Anemia in other chronic diseases classified elsewhere: Secondary | ICD-10-CM | POA: Diagnosis present

## 2016-11-21 DIAGNOSIS — N184 Chronic kidney disease, stage 4 (severe): Secondary | ICD-10-CM | POA: Diagnosis not present

## 2016-11-21 DIAGNOSIS — N189 Chronic kidney disease, unspecified: Secondary | ICD-10-CM

## 2016-11-21 DIAGNOSIS — Z862 Personal history of diseases of the blood and blood-forming organs and certain disorders involving the immune mechanism: Secondary | ICD-10-CM

## 2016-11-21 LAB — IRON AND TIBC
IRON: 41 ug/dL (ref 28–170)
Saturation Ratios: 23 % (ref 10.4–31.8)
TIBC: 182 ug/dL — ABNORMAL LOW (ref 250–450)
UIBC: 141 ug/dL

## 2016-11-21 LAB — POCT HEMOGLOBIN-HEMACUE: HEMOGLOBIN: 9.2 g/dL — AB (ref 12.0–15.0)

## 2016-11-21 LAB — FERRITIN: FERRITIN: 764 ng/mL — AB (ref 11–307)

## 2016-11-21 MED ORDER — DARBEPOETIN ALFA 100 MCG/0.5ML IJ SOSY
PREFILLED_SYRINGE | INTRAMUSCULAR | Status: AC
  Administered 2016-11-21: 13:00:00 100 ug via SUBCUTANEOUS
  Filled 2016-11-21: qty 0.5

## 2016-11-21 MED ORDER — DARBEPOETIN ALFA 100 MCG/0.5ML IJ SOSY
100.0000 ug | PREFILLED_SYRINGE | INTRAMUSCULAR | Status: DC
  Administered 2016-11-21: 100 ug via SUBCUTANEOUS

## 2016-11-22 ENCOUNTER — Other Ambulatory Visit: Payer: Self-pay | Admitting: Adult Health

## 2016-11-24 DIAGNOSIS — R609 Edema, unspecified: Secondary | ICD-10-CM | POA: Diagnosis not present

## 2016-11-24 DIAGNOSIS — E1159 Type 2 diabetes mellitus with other circulatory complications: Secondary | ICD-10-CM | POA: Diagnosis not present

## 2016-11-24 DIAGNOSIS — G309 Alzheimer's disease, unspecified: Secondary | ICD-10-CM | POA: Diagnosis not present

## 2016-11-24 DIAGNOSIS — N186 End stage renal disease: Secondary | ICD-10-CM | POA: Diagnosis not present

## 2016-11-24 DIAGNOSIS — I10 Essential (primary) hypertension: Secondary | ICD-10-CM | POA: Diagnosis not present

## 2016-11-24 DIAGNOSIS — E785 Hyperlipidemia, unspecified: Secondary | ICD-10-CM | POA: Diagnosis not present

## 2016-11-27 DIAGNOSIS — I10 Essential (primary) hypertension: Secondary | ICD-10-CM | POA: Diagnosis not present

## 2016-11-27 DIAGNOSIS — R609 Edema, unspecified: Secondary | ICD-10-CM | POA: Diagnosis not present

## 2016-11-27 DIAGNOSIS — E1159 Type 2 diabetes mellitus with other circulatory complications: Secondary | ICD-10-CM | POA: Diagnosis not present

## 2016-11-27 DIAGNOSIS — G309 Alzheimer's disease, unspecified: Secondary | ICD-10-CM | POA: Diagnosis not present

## 2016-11-27 DIAGNOSIS — E785 Hyperlipidemia, unspecified: Secondary | ICD-10-CM | POA: Diagnosis not present

## 2016-11-27 DIAGNOSIS — N186 End stage renal disease: Secondary | ICD-10-CM | POA: Diagnosis not present

## 2016-11-28 DIAGNOSIS — G309 Alzheimer's disease, unspecified: Secondary | ICD-10-CM | POA: Diagnosis not present

## 2016-11-28 DIAGNOSIS — E785 Hyperlipidemia, unspecified: Secondary | ICD-10-CM | POA: Diagnosis not present

## 2016-11-28 DIAGNOSIS — E1159 Type 2 diabetes mellitus with other circulatory complications: Secondary | ICD-10-CM | POA: Diagnosis not present

## 2016-11-28 DIAGNOSIS — N186 End stage renal disease: Secondary | ICD-10-CM | POA: Diagnosis not present

## 2016-11-28 DIAGNOSIS — I10 Essential (primary) hypertension: Secondary | ICD-10-CM | POA: Diagnosis not present

## 2016-11-28 DIAGNOSIS — R609 Edema, unspecified: Secondary | ICD-10-CM | POA: Diagnosis not present

## 2016-12-01 DIAGNOSIS — E1159 Type 2 diabetes mellitus with other circulatory complications: Secondary | ICD-10-CM | POA: Diagnosis not present

## 2016-12-01 DIAGNOSIS — G309 Alzheimer's disease, unspecified: Secondary | ICD-10-CM | POA: Diagnosis not present

## 2016-12-01 DIAGNOSIS — I10 Essential (primary) hypertension: Secondary | ICD-10-CM | POA: Diagnosis not present

## 2016-12-01 DIAGNOSIS — N186 End stage renal disease: Secondary | ICD-10-CM | POA: Diagnosis not present

## 2016-12-01 DIAGNOSIS — R609 Edema, unspecified: Secondary | ICD-10-CM | POA: Diagnosis not present

## 2016-12-01 DIAGNOSIS — E785 Hyperlipidemia, unspecified: Secondary | ICD-10-CM | POA: Diagnosis not present

## 2016-12-02 ENCOUNTER — Encounter (HOSPITAL_COMMUNITY)
Admission: RE | Admit: 2016-12-02 | Discharge: 2016-12-02 | Disposition: A | Discharge status: Home / Self Care | Source: Ambulatory Visit | Attending: Nephrology | Admitting: Nephrology

## 2016-12-02 DIAGNOSIS — N189 Chronic kidney disease, unspecified: Principal | ICD-10-CM

## 2016-12-02 DIAGNOSIS — Z862 Personal history of diseases of the blood and blood-forming organs and certain disorders involving the immune mechanism: Secondary | ICD-10-CM

## 2016-12-02 DIAGNOSIS — N184 Chronic kidney disease, stage 4 (severe): Secondary | ICD-10-CM | POA: Diagnosis not present

## 2016-12-02 LAB — POCT HEMOGLOBIN-HEMACUE: Hemoglobin: 9 g/dL — ABNORMAL LOW (ref 12.0–15.0)

## 2016-12-02 MED ORDER — DARBEPOETIN ALFA 100 MCG/0.5ML IJ SOSY
PREFILLED_SYRINGE | INTRAMUSCULAR | Status: AC
  Administered 2016-12-02: 100 ug via SUBCUTANEOUS
  Filled 2016-12-02: qty 0.5

## 2016-12-02 MED ORDER — DARBEPOETIN ALFA 100 MCG/0.5ML IJ SOSY
100.0000 ug | PREFILLED_SYRINGE | INTRAMUSCULAR | Status: DC
  Administered 2016-12-02: 100 ug via SUBCUTANEOUS

## 2016-12-03 DIAGNOSIS — E1159 Type 2 diabetes mellitus with other circulatory complications: Secondary | ICD-10-CM | POA: Diagnosis not present

## 2016-12-03 DIAGNOSIS — N186 End stage renal disease: Secondary | ICD-10-CM | POA: Diagnosis not present

## 2016-12-03 DIAGNOSIS — I10 Essential (primary) hypertension: Secondary | ICD-10-CM | POA: Diagnosis not present

## 2016-12-03 DIAGNOSIS — E785 Hyperlipidemia, unspecified: Secondary | ICD-10-CM | POA: Diagnosis not present

## 2016-12-03 DIAGNOSIS — R609 Edema, unspecified: Secondary | ICD-10-CM | POA: Diagnosis not present

## 2016-12-03 DIAGNOSIS — G309 Alzheimer's disease, unspecified: Secondary | ICD-10-CM | POA: Diagnosis not present

## 2016-12-04 DIAGNOSIS — G309 Alzheimer's disease, unspecified: Secondary | ICD-10-CM | POA: Diagnosis not present

## 2016-12-04 DIAGNOSIS — E785 Hyperlipidemia, unspecified: Secondary | ICD-10-CM | POA: Diagnosis not present

## 2016-12-04 DIAGNOSIS — R609 Edema, unspecified: Secondary | ICD-10-CM | POA: Diagnosis not present

## 2016-12-04 DIAGNOSIS — E1159 Type 2 diabetes mellitus with other circulatory complications: Secondary | ICD-10-CM | POA: Diagnosis not present

## 2016-12-04 DIAGNOSIS — N186 End stage renal disease: Secondary | ICD-10-CM | POA: Diagnosis not present

## 2016-12-04 DIAGNOSIS — I10 Essential (primary) hypertension: Secondary | ICD-10-CM | POA: Diagnosis not present

## 2016-12-07 DIAGNOSIS — G309 Alzheimer's disease, unspecified: Secondary | ICD-10-CM | POA: Diagnosis not present

## 2016-12-07 DIAGNOSIS — I10 Essential (primary) hypertension: Secondary | ICD-10-CM | POA: Diagnosis not present

## 2016-12-07 DIAGNOSIS — E1159 Type 2 diabetes mellitus with other circulatory complications: Secondary | ICD-10-CM | POA: Diagnosis not present

## 2016-12-07 DIAGNOSIS — R634 Abnormal weight loss: Secondary | ICD-10-CM | POA: Diagnosis not present

## 2016-12-07 DIAGNOSIS — R630 Anorexia: Secondary | ICD-10-CM | POA: Diagnosis not present

## 2016-12-07 DIAGNOSIS — E785 Hyperlipidemia, unspecified: Secondary | ICD-10-CM | POA: Diagnosis not present

## 2016-12-07 DIAGNOSIS — R609 Edema, unspecified: Secondary | ICD-10-CM | POA: Diagnosis not present

## 2016-12-07 DIAGNOSIS — N186 End stage renal disease: Secondary | ICD-10-CM | POA: Diagnosis not present

## 2016-12-07 DIAGNOSIS — D631 Anemia in chronic kidney disease: Secondary | ICD-10-CM | POA: Diagnosis not present

## 2016-12-07 DIAGNOSIS — M109 Gout, unspecified: Secondary | ICD-10-CM | POA: Diagnosis not present

## 2016-12-08 DIAGNOSIS — E785 Hyperlipidemia, unspecified: Secondary | ICD-10-CM | POA: Diagnosis not present

## 2016-12-08 DIAGNOSIS — N186 End stage renal disease: Secondary | ICD-10-CM | POA: Diagnosis not present

## 2016-12-08 DIAGNOSIS — R609 Edema, unspecified: Secondary | ICD-10-CM | POA: Diagnosis not present

## 2016-12-08 DIAGNOSIS — G309 Alzheimer's disease, unspecified: Secondary | ICD-10-CM | POA: Diagnosis not present

## 2016-12-08 DIAGNOSIS — E1159 Type 2 diabetes mellitus with other circulatory complications: Secondary | ICD-10-CM | POA: Diagnosis not present

## 2016-12-08 DIAGNOSIS — I10 Essential (primary) hypertension: Secondary | ICD-10-CM | POA: Diagnosis not present

## 2016-12-10 DIAGNOSIS — G309 Alzheimer's disease, unspecified: Secondary | ICD-10-CM | POA: Diagnosis not present

## 2016-12-10 DIAGNOSIS — E1159 Type 2 diabetes mellitus with other circulatory complications: Secondary | ICD-10-CM | POA: Diagnosis not present

## 2016-12-10 DIAGNOSIS — E785 Hyperlipidemia, unspecified: Secondary | ICD-10-CM | POA: Diagnosis not present

## 2016-12-10 DIAGNOSIS — N186 End stage renal disease: Secondary | ICD-10-CM | POA: Diagnosis not present

## 2016-12-10 DIAGNOSIS — R609 Edema, unspecified: Secondary | ICD-10-CM | POA: Diagnosis not present

## 2016-12-10 DIAGNOSIS — I10 Essential (primary) hypertension: Secondary | ICD-10-CM | POA: Diagnosis not present

## 2016-12-11 DIAGNOSIS — E1159 Type 2 diabetes mellitus with other circulatory complications: Secondary | ICD-10-CM | POA: Diagnosis not present

## 2016-12-11 DIAGNOSIS — R609 Edema, unspecified: Secondary | ICD-10-CM | POA: Diagnosis not present

## 2016-12-11 DIAGNOSIS — E785 Hyperlipidemia, unspecified: Secondary | ICD-10-CM | POA: Diagnosis not present

## 2016-12-11 DIAGNOSIS — I10 Essential (primary) hypertension: Secondary | ICD-10-CM | POA: Diagnosis not present

## 2016-12-11 DIAGNOSIS — N186 End stage renal disease: Secondary | ICD-10-CM | POA: Diagnosis not present

## 2016-12-11 DIAGNOSIS — G309 Alzheimer's disease, unspecified: Secondary | ICD-10-CM | POA: Diagnosis not present

## 2016-12-12 DIAGNOSIS — E1159 Type 2 diabetes mellitus with other circulatory complications: Secondary | ICD-10-CM | POA: Diagnosis not present

## 2016-12-12 DIAGNOSIS — I10 Essential (primary) hypertension: Secondary | ICD-10-CM | POA: Diagnosis not present

## 2016-12-12 DIAGNOSIS — E785 Hyperlipidemia, unspecified: Secondary | ICD-10-CM | POA: Diagnosis not present

## 2016-12-12 DIAGNOSIS — R609 Edema, unspecified: Secondary | ICD-10-CM | POA: Diagnosis not present

## 2016-12-12 DIAGNOSIS — N186 End stage renal disease: Secondary | ICD-10-CM | POA: Diagnosis not present

## 2016-12-12 DIAGNOSIS — G309 Alzheimer's disease, unspecified: Secondary | ICD-10-CM | POA: Diagnosis not present

## 2016-12-13 DIAGNOSIS — E785 Hyperlipidemia, unspecified: Secondary | ICD-10-CM | POA: Diagnosis not present

## 2016-12-13 DIAGNOSIS — N186 End stage renal disease: Secondary | ICD-10-CM | POA: Diagnosis not present

## 2016-12-13 DIAGNOSIS — I10 Essential (primary) hypertension: Secondary | ICD-10-CM | POA: Diagnosis not present

## 2016-12-13 DIAGNOSIS — R609 Edema, unspecified: Secondary | ICD-10-CM | POA: Diagnosis not present

## 2016-12-13 DIAGNOSIS — G309 Alzheimer's disease, unspecified: Secondary | ICD-10-CM | POA: Diagnosis not present

## 2016-12-13 DIAGNOSIS — E1159 Type 2 diabetes mellitus with other circulatory complications: Secondary | ICD-10-CM | POA: Diagnosis not present

## 2016-12-14 DIAGNOSIS — G309 Alzheimer's disease, unspecified: Secondary | ICD-10-CM | POA: Diagnosis not present

## 2016-12-14 DIAGNOSIS — N186 End stage renal disease: Secondary | ICD-10-CM | POA: Diagnosis not present

## 2016-12-14 DIAGNOSIS — E1159 Type 2 diabetes mellitus with other circulatory complications: Secondary | ICD-10-CM | POA: Diagnosis not present

## 2016-12-14 DIAGNOSIS — I10 Essential (primary) hypertension: Secondary | ICD-10-CM | POA: Diagnosis not present

## 2016-12-14 DIAGNOSIS — E785 Hyperlipidemia, unspecified: Secondary | ICD-10-CM | POA: Diagnosis not present

## 2016-12-14 DIAGNOSIS — R609 Edema, unspecified: Secondary | ICD-10-CM | POA: Diagnosis not present

## 2016-12-15 DIAGNOSIS — E785 Hyperlipidemia, unspecified: Secondary | ICD-10-CM | POA: Diagnosis not present

## 2016-12-15 DIAGNOSIS — N186 End stage renal disease: Secondary | ICD-10-CM | POA: Diagnosis not present

## 2016-12-15 DIAGNOSIS — E1159 Type 2 diabetes mellitus with other circulatory complications: Secondary | ICD-10-CM | POA: Diagnosis not present

## 2016-12-15 DIAGNOSIS — G309 Alzheimer's disease, unspecified: Secondary | ICD-10-CM | POA: Diagnosis not present

## 2016-12-15 DIAGNOSIS — I10 Essential (primary) hypertension: Secondary | ICD-10-CM | POA: Diagnosis not present

## 2016-12-15 DIAGNOSIS — R609 Edema, unspecified: Secondary | ICD-10-CM | POA: Diagnosis not present

## 2016-12-16 ENCOUNTER — Inpatient Hospital Stay (HOSPITAL_COMMUNITY): Admission: RE | Admit: 2016-12-16 | Source: Ambulatory Visit

## 2016-12-16 DIAGNOSIS — E785 Hyperlipidemia, unspecified: Secondary | ICD-10-CM | POA: Diagnosis not present

## 2016-12-16 DIAGNOSIS — E1159 Type 2 diabetes mellitus with other circulatory complications: Secondary | ICD-10-CM | POA: Diagnosis not present

## 2016-12-16 DIAGNOSIS — I10 Essential (primary) hypertension: Secondary | ICD-10-CM | POA: Diagnosis not present

## 2016-12-16 DIAGNOSIS — R609 Edema, unspecified: Secondary | ICD-10-CM | POA: Diagnosis not present

## 2016-12-16 DIAGNOSIS — N186 End stage renal disease: Secondary | ICD-10-CM | POA: Diagnosis not present

## 2016-12-16 DIAGNOSIS — G309 Alzheimer's disease, unspecified: Secondary | ICD-10-CM | POA: Diagnosis not present

## 2017-01-06 DEATH — deceased

## 2017-04-01 IMAGING — US US RENAL
1 series · 13 of 25 positions shown · non-contrast
Comparison: CT abdomen and pelvis 10/22/2015

CLINICAL DATA: Acute renal failure. History of hypertension and
diabetes. Chronic kidney disease.

EXAM:
RENAL / URINARY TRACT ULTRASOUND COMPLETE

[Series 1: us renal · 0.23mm/px · 13 of 52 slices shown]
[im 1/52]
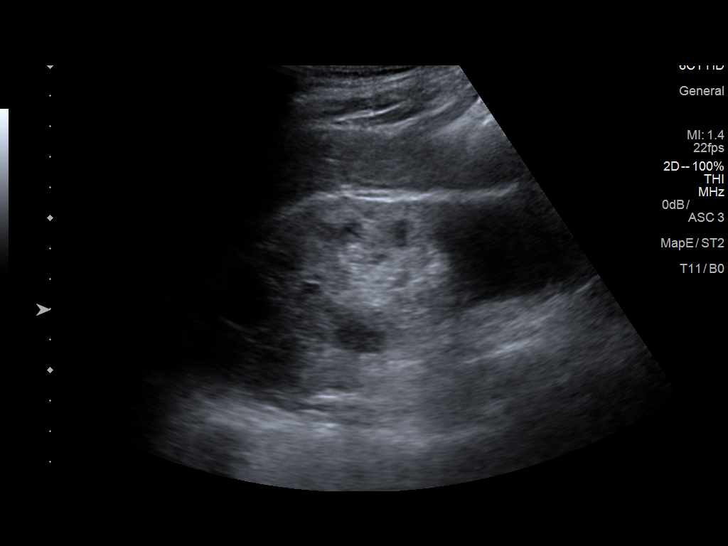
[im 5/52]
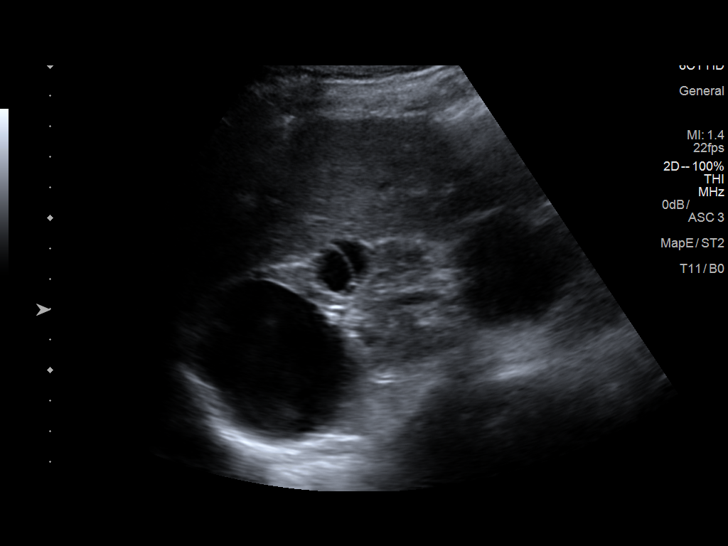
[im 9/52]
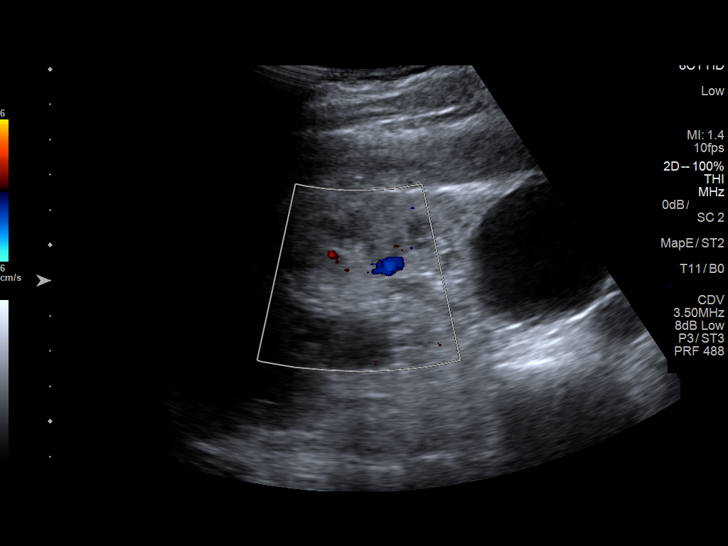
[im 13/52]
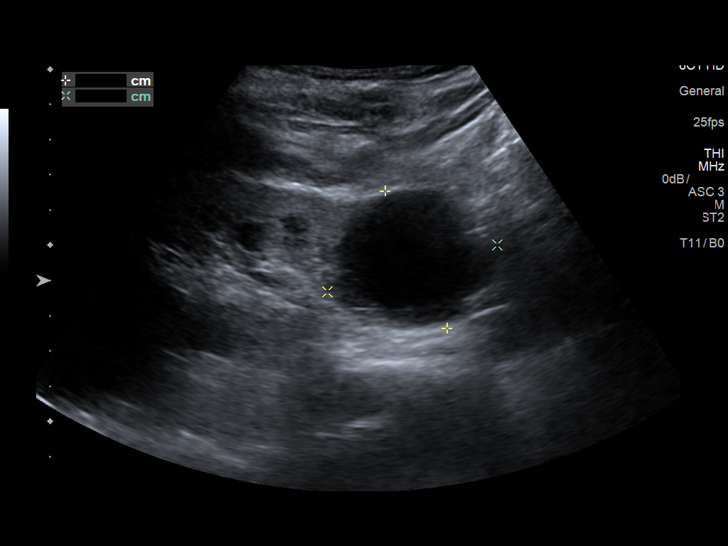
[im 18/52]
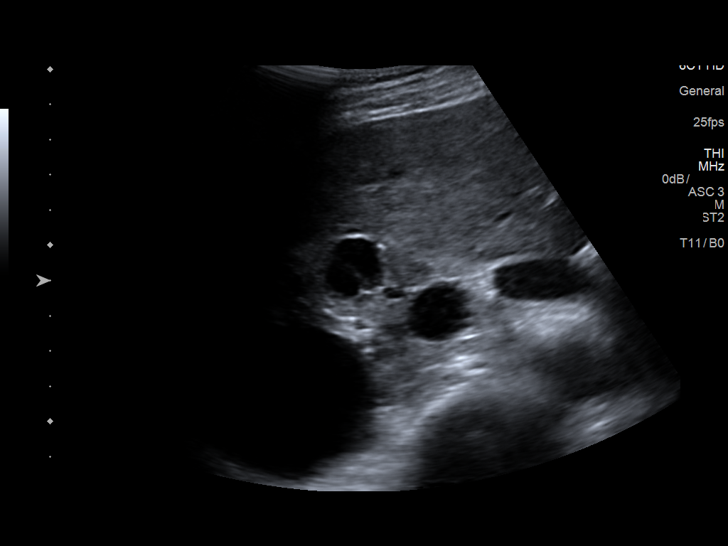
[im 22/52]
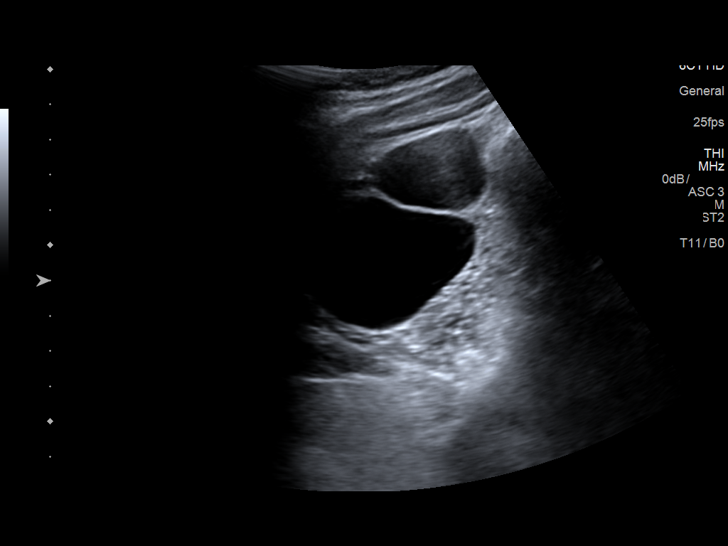
[im 26/52]
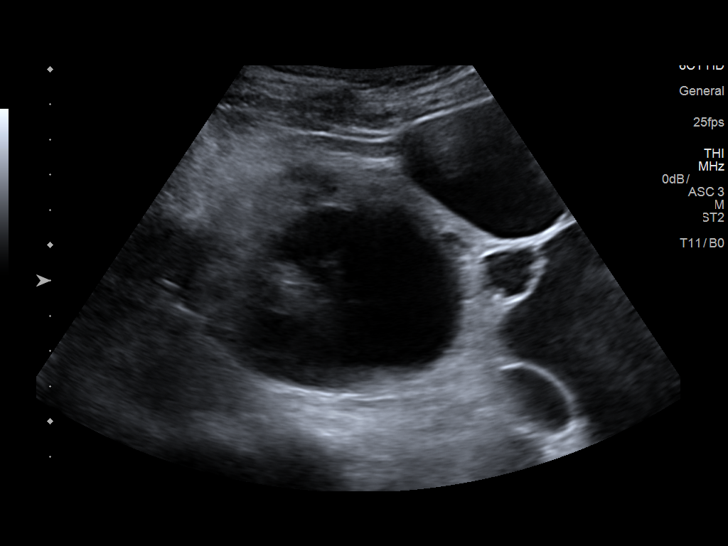
[im 30/52]
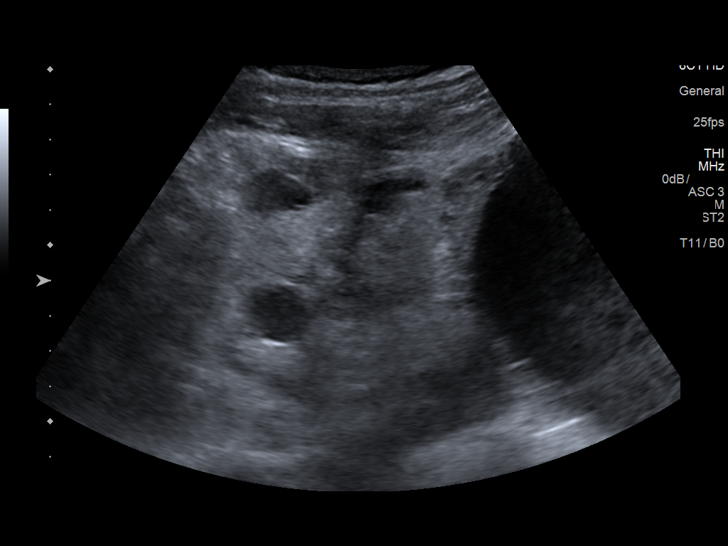
[im 35/52]
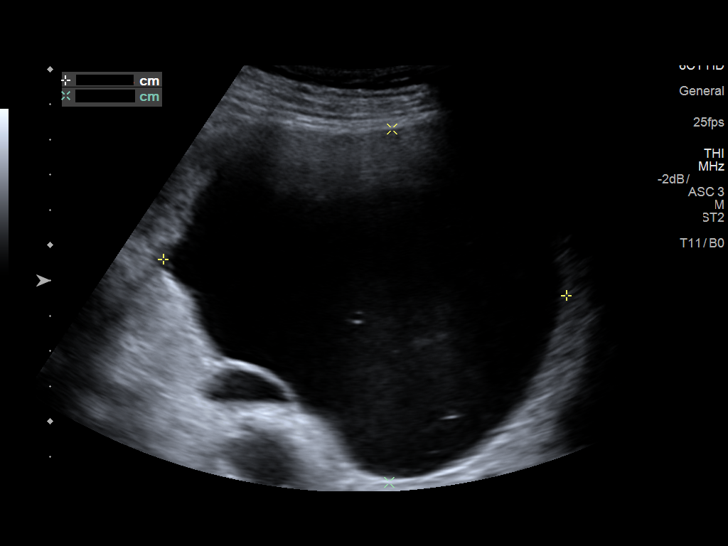
[im 39/52]
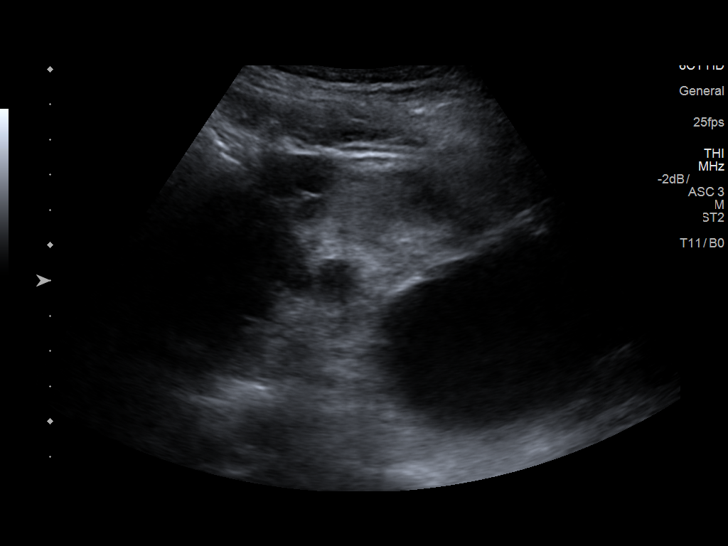
[im 43/52]
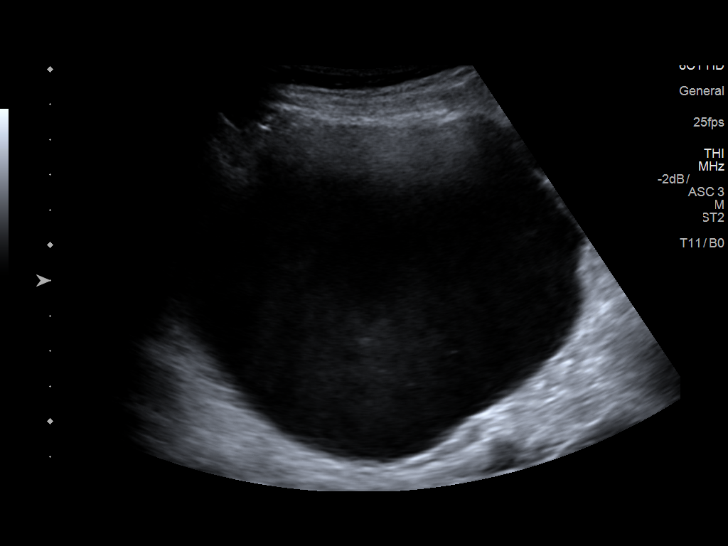
[im 47/52]
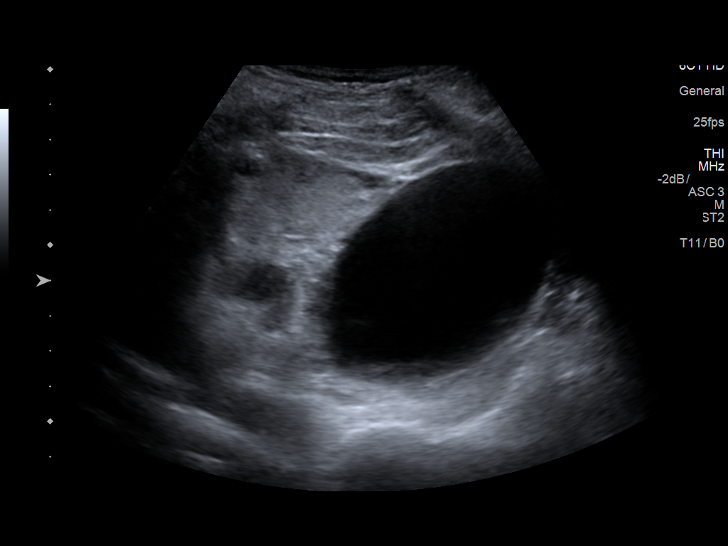
[im 52/52]
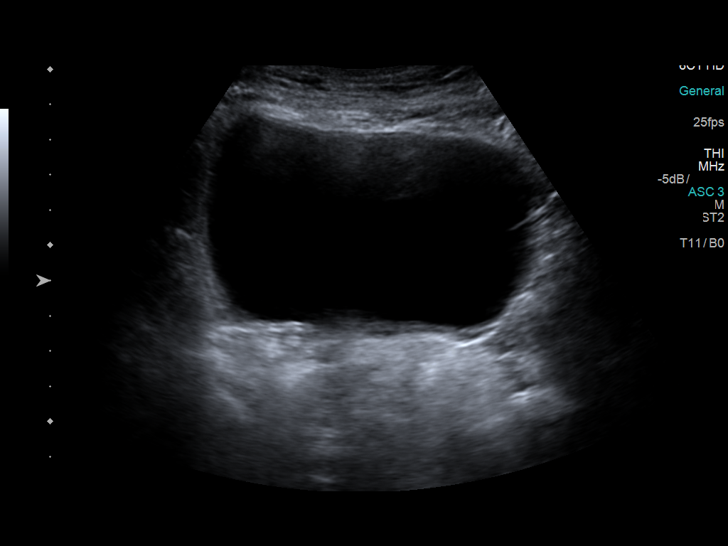

[13 of 25 positions shown; findings below may reference images not displayed]

FINDINGS: Right Kidney:

Length: 8.2 cm. Diffusely increased parenchymal echotexture
consistent with chronic medical renal disease. Multiple simple
appearing cyst demonstrated throughout the right kidney. Largest is
in the upper pole and measures about 5.8 cm maximal diameter. Second
dominant cyst in the lower pole measuring 5 cm maximal diameter. No
solid mass lesion or hydronephrosis demonstrated.

Left Kidney:

Length: 9.6 cm. Diffusely increased parenchymal echotexture
consistent with chronic medical renal disease. Multiple simple
appearing cysts demonstrated throughout the left kidney. Largest is
in the lower pole, measuring about 12 cm maximal diameter. Second
dominant cyst is in the midpole measuring about 7.8 cm maximal
diameter. No solid mass lesion or hydronephrosis demonstrated.

Bladder:

No filling defect or wall thickening appreciated.
IMPRESSION: Parenchymal changes consistent with chronic medical renal disease
bilaterally. Multiple bilateral cysts. No hydronephrosis.

## 2017-10-02 IMAGING — CT CT ABD-PELV W/O CM
2 of 4 series · 14 of 46 positions shown, 16 images · non-contrast
Comparison: None.

CLINICAL DATA: 74-year-old female with acute renal failure, rectal
and lower abdominal pain as well as constipation.

EXAM:
CT ABDOMEN AND PELVIS WITHOUT CONTRAST
TECHNIQUE: Multidetector CT imaging of the abdomen and pelvis was performed
following the standard protocol without IV contrast.

[Series 2: a/p w/o 5mm · axial · non-contrast · 0.68mm/px · z∈[-421,-86]mm · 11 of 81 slices shown, 13 images]
[im 7/81  soft-tissue]
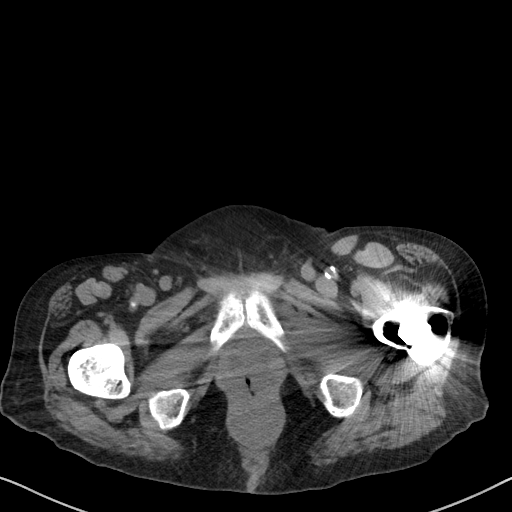
[im 7/81  bone]
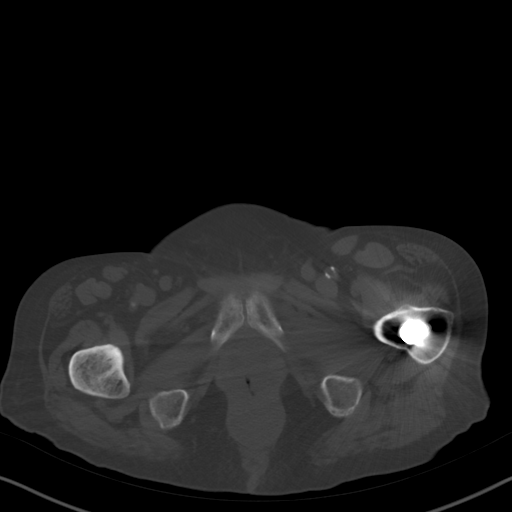
[im 13/81  soft-tissue]
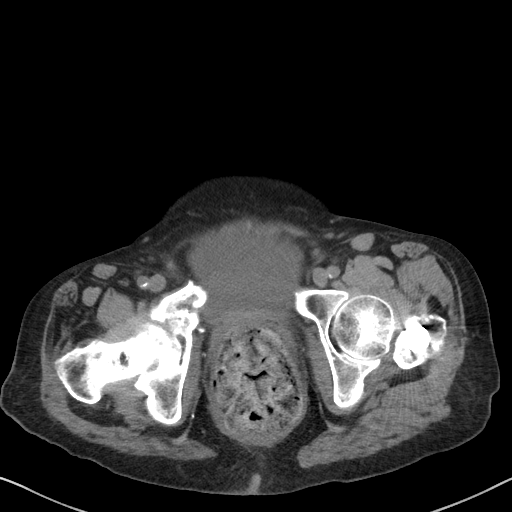
[im 20/81  soft-tissue]
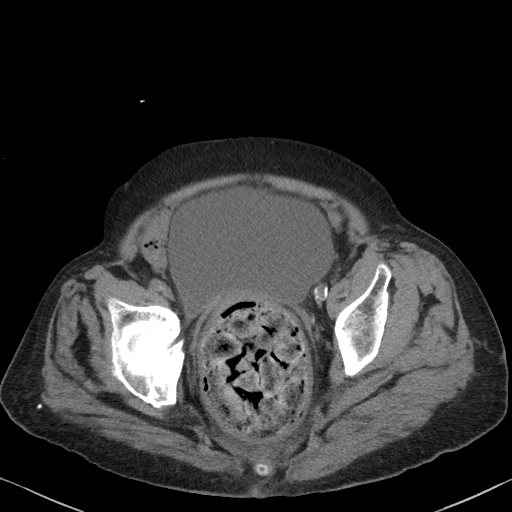
[im 26/81  soft-tissue]
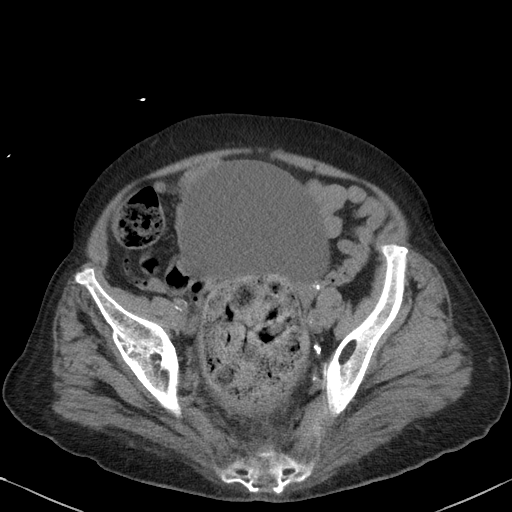
[im 33/81  soft-tissue]
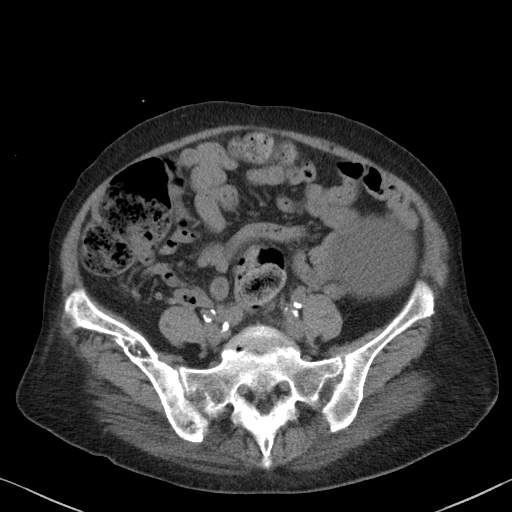
[im 42/81  soft-tissue]
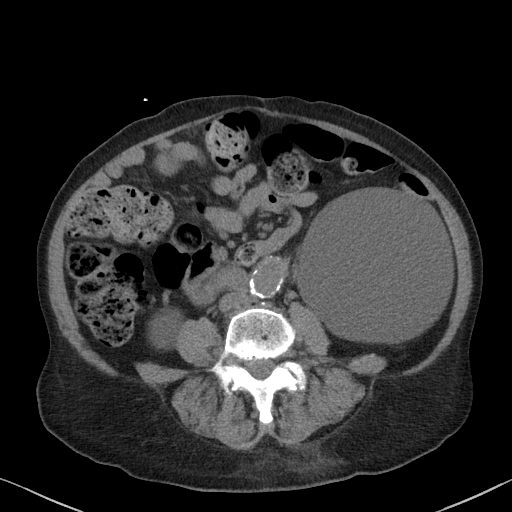
[im 49/81  soft-tissue]
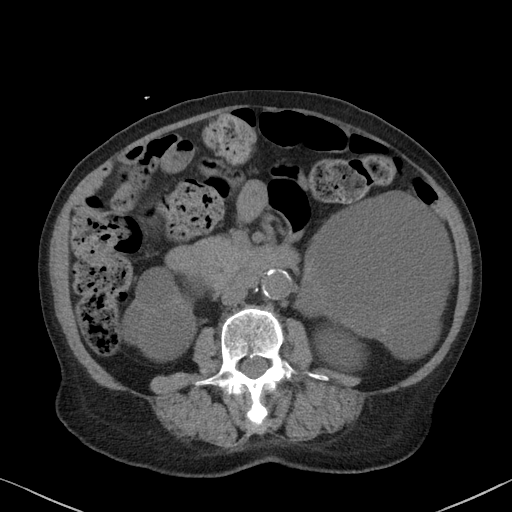
[im 55/81  soft-tissue]
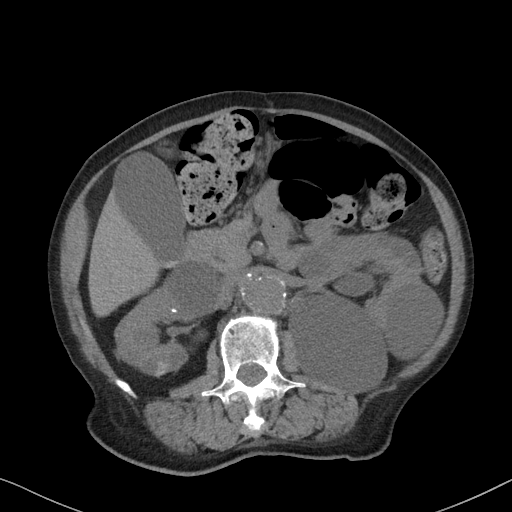
[im 61/81  soft-tissue]
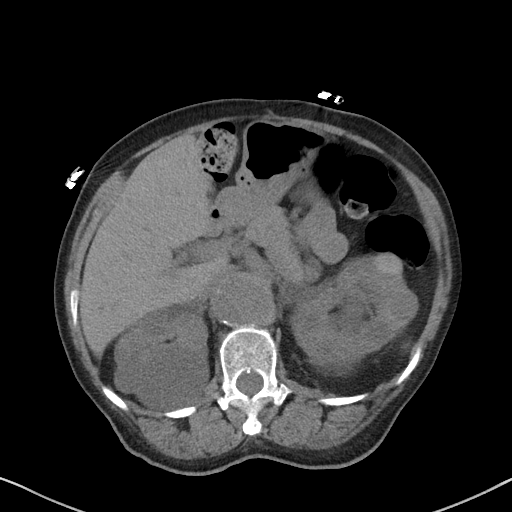
[im 61/81  bone]
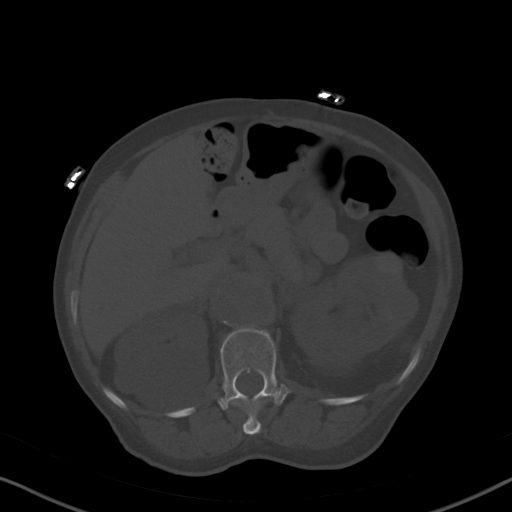
[im 68/81  soft-tissue]
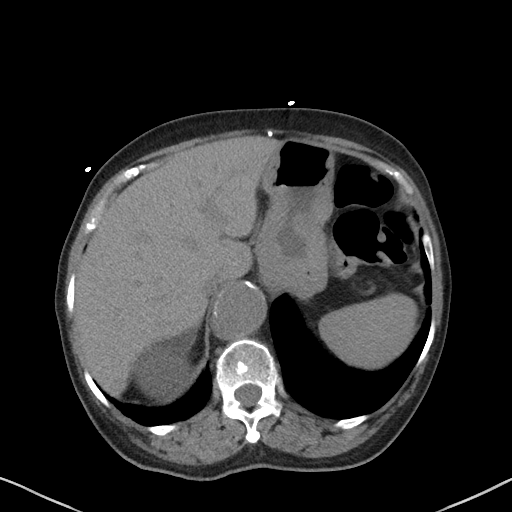
[im 74/81  soft-tissue]
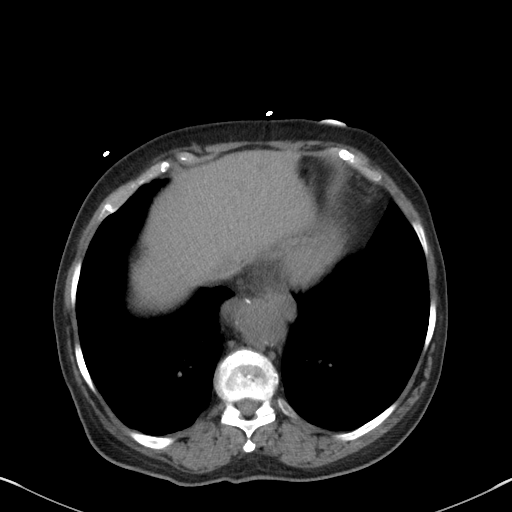

[Series 5: a/p w/o cor · coronal · non-contrast · 0.62mm/px · 3 of 136 slices shown]
[im 46/136  soft-tissue]
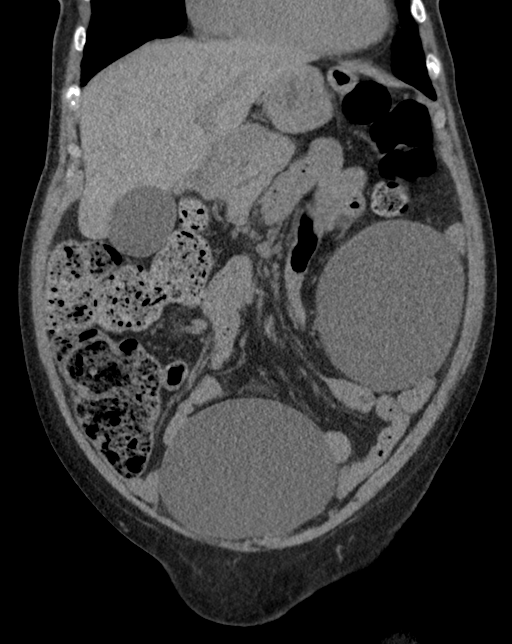
[im 61/136  soft-tissue]
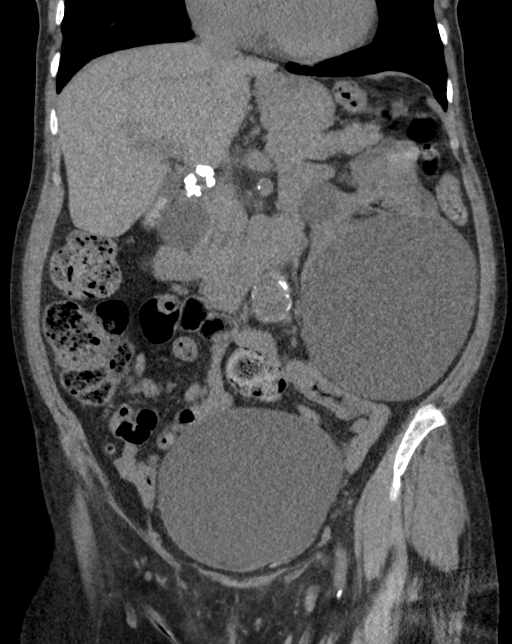
[im 76/136  soft-tissue]
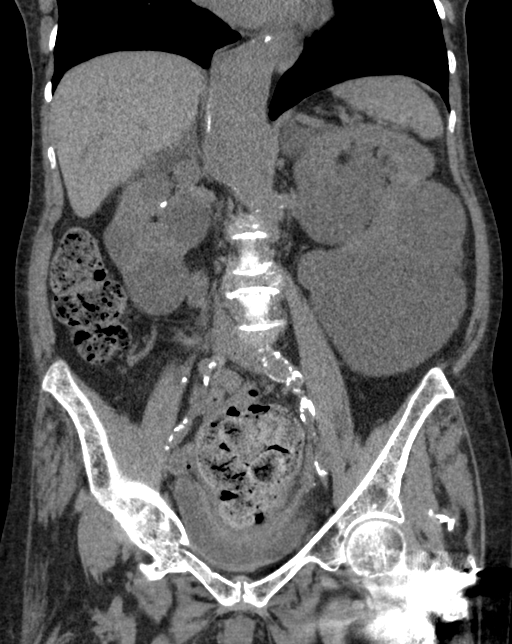

[14 of 46 positions shown; findings below may reference images not displayed]

FINDINGS: Lower Chest: Mild dependent atelectasis in the lower lobes. The
visualized cardiac structures are within normal limits for size. No
pericardial effusion. Atherosclerotic, and ectatic thoracic aorta.
There is focal outpouching of the right posterolateral wall in the 7
o'clock position suggesting the presence of a penetrating aortic
ulcer. This is not well seen in the absence of intravenous contrast.
The aorta measures up to 4.5 cm in diameter at this location.

Abdomen: Unremarkable CT appearance of the stomach, duodenum,
spleen, adrenal glands and pancreas. Normal hepatic contour and
morphology. Punctate calcification in the medial segment of the left
hepatic lobe likely reflects old granulomatous disease. Small volume
stones layer within the gallbladder neck. No secondary signs of
cholecystitis. Calcified portacaval nodes noted incidentally.

Numerous circumscribed low-attenuation lesions exophytic from both
kidneys of varying size and complexity. The larger lesions are water
attenuation and highly likely simple cysts. Several of the smaller
lesions are hyperdense consistent with benign proteinaceous or
hemorrhagic cysts. No definite hydronephrosis or nephrolithiasis.
The largest individual cyst on the right measures 6.1 cm. The low
largest low-attenuation cyst on the left measures 11.9 cm exophytic
from the upper pole.

Large rectal stool ball with mild submucosal edema and inflammatory
interstitial stranding. Findings suggest stercoral colitis. No free
fluid or suspicious adenopathy. No evidence of small bowel
obstruction. Normal appendix in the right lower quadrant.

Pelvis: Surgical changes of prior hysterectomy. Unremarkable
bladder. No abnormal lymphadenopathy.

Musculoskeletal: The bones are diffusely demineralized. Surgical
changes of prior left femoral ORIF without evidence of hardware
complication in the visualized segment. Remote prior fracture versus
bone graft harvest site in the right iliac wing. Moderate right hip
joint osteoarthritis. Multilevel degenerative disc disease and lower
lumbar facet arthropathy.

Vascular: Descending thoracic aortic aneurysm measuring up to 4.8 cm
at the aortic hiatus. Scattered atherosclerotic vascular
calcifications. Ectatic infrarenal abdominal aorta measuring up to
2.6 cm. No definite abdominal aortic aneurysm.
IMPRESSION: 1. Large rectal stool ball with associated submucosal edema and
perirectal inflammatory stranding. Imaging findings suggest fecal
impaction and stercoral proctitis.
2. Distal descending thoracic aortic aneurysm measuring up to 4.8 cm
at the aortic hiatus. There are no definitive guidelines for imaging
follow-up for descending thoracic aortic aneurysm. It would be
reasonable to refer to vascular surgery (non emergent) given size of
4.8 cm for further evaluation and additional imaging follow-up.
3. Probable small focal penetrating aortic ulcer in the distal
descending thoracic aorta.
4. Numerous renal cysts of varying complexity bilaterally measuring
up to nearly 12 cm on the left. Cysts are incompletely characterized
in the absence of intravenous contrast material. The majority of the
cysts are either high density consistent with
proteinaceous/hemorrhagic cysts or very low density suggesting
simple cysts.
5. Multilevel degenerative disc disease and lower lumbar facet
arthropathy.
6. Moderate right hip joint osteoarthritis.
7. Sequelae of old granulomatous disease involving lymph nodes in
the porta hepatis and the medial segments of the left liver.

## 2023-10-13 NOTE — Progress Notes (Signed)
 This encounter was created in error - please disregard.
